# Patient Record
Sex: Female | Born: 1950 | Race: White | Hispanic: No | Marital: Married | Smoking: Never smoker
Health system: Southern US, Community
[De-identification: ages and names within clinical notes are randomized; demographics above are authoritative.]

## PROBLEM LIST (undated history)

## (undated) DIAGNOSIS — E7800 Pure hypercholesterolemia, unspecified: Secondary | ICD-10-CM

## (undated) DIAGNOSIS — Z86718 Personal history of other venous thrombosis and embolism: Secondary | ICD-10-CM

## (undated) DIAGNOSIS — I447 Left bundle-branch block, unspecified: Secondary | ICD-10-CM

## (undated) DIAGNOSIS — Z8614 Personal history of Methicillin resistant Staphylococcus aureus infection: Secondary | ICD-10-CM

## (undated) DIAGNOSIS — I10 Essential (primary) hypertension: Secondary | ICD-10-CM

## (undated) DIAGNOSIS — I482 Chronic atrial fibrillation, unspecified: Secondary | ICD-10-CM

## (undated) DIAGNOSIS — F329 Major depressive disorder, single episode, unspecified: Secondary | ICD-10-CM

## (undated) DIAGNOSIS — F419 Anxiety disorder, unspecified: Secondary | ICD-10-CM

## (undated) DIAGNOSIS — G600 Hereditary motor and sensory neuropathy: Secondary | ICD-10-CM

## (undated) DIAGNOSIS — F40240 Claustrophobia: Secondary | ICD-10-CM

## (undated) DIAGNOSIS — N189 Chronic kidney disease, unspecified: Secondary | ICD-10-CM

## (undated) DIAGNOSIS — I5032 Chronic diastolic (congestive) heart failure: Secondary | ICD-10-CM

## (undated) DIAGNOSIS — K219 Gastro-esophageal reflux disease without esophagitis: Secondary | ICD-10-CM

## (undated) DIAGNOSIS — R569 Unspecified convulsions: Secondary | ICD-10-CM

## (undated) DIAGNOSIS — F32A Depression, unspecified: Secondary | ICD-10-CM

## (undated) DIAGNOSIS — E039 Hypothyroidism, unspecified: Secondary | ICD-10-CM

## (undated) DIAGNOSIS — E119 Type 2 diabetes mellitus without complications: Secondary | ICD-10-CM

## (undated) DIAGNOSIS — G4730 Sleep apnea, unspecified: Secondary | ICD-10-CM

## (undated) DIAGNOSIS — I2510 Atherosclerotic heart disease of native coronary artery without angina pectoris: Secondary | ICD-10-CM

## (undated) DIAGNOSIS — M7060 Trochanteric bursitis, unspecified hip: Secondary | ICD-10-CM

## (undated) DIAGNOSIS — G43909 Migraine, unspecified, not intractable, without status migrainosus: Secondary | ICD-10-CM

## (undated) DIAGNOSIS — R55 Syncope and collapse: Secondary | ICD-10-CM

## (undated) HISTORY — DX: Major depressive disorder, single episode, unspecified: F32.9

## (undated) HISTORY — DX: Trochanteric bursitis, unspecified hip: M70.60

## (undated) HISTORY — DX: Essential (primary) hypertension: I10

## (undated) HISTORY — DX: Hereditary motor and sensory neuropathy: G60.0

## (undated) HISTORY — DX: Hypothyroidism, unspecified: E03.9

## (undated) HISTORY — DX: Unspecified convulsions: R56.9

## (undated) HISTORY — DX: Chronic kidney disease, unspecified: N18.9

## (undated) HISTORY — DX: Syncope and collapse: R55

## (undated) HISTORY — DX: Anxiety disorder, unspecified: F41.9

## (undated) HISTORY — DX: Gastro-esophageal reflux disease without esophagitis: K21.9

## (undated) HISTORY — PX: TOE AMPUTATION: SHX809

## (undated) HISTORY — PX: FEMUR SURGERY: SHX943

## (undated) HISTORY — DX: Depression, unspecified: F32.A

## (undated) HISTORY — DX: Type 2 diabetes mellitus without complications: E11.9

## (undated) HISTORY — PX: TONSILLECTOMY AND ADENOIDECTOMY: SUR1326

## (undated) HISTORY — DX: Pure hypercholesterolemia, unspecified: E78.00

## (undated) HISTORY — DX: Chronic diastolic (congestive) heart failure: I50.32

## (undated) HISTORY — DX: Personal history of Methicillin resistant Staphylococcus aureus infection: Z86.14

## (undated) HISTORY — DX: Claustrophobia: F40.240

## (undated) HISTORY — DX: Personal history of other venous thrombosis and embolism: Z86.718

## (undated) HISTORY — DX: Sleep apnea, unspecified: G47.30

## (undated) HISTORY — DX: Chronic atrial fibrillation, unspecified: I48.20

## (undated) HISTORY — DX: Migraine, unspecified, not intractable, without status migrainosus: G43.909

## (undated) HISTORY — DX: Morbid (severe) obesity due to excess calories: E66.01

## (undated) HISTORY — DX: Left bundle-branch block, unspecified: I44.7

## (undated) HISTORY — PX: TOTAL ABDOMINAL HYSTERECTOMY: SHX209

## (undated) HISTORY — DX: Atherosclerotic heart disease of native coronary artery without angina pectoris: I25.10

---

## 2008-11-13 ENCOUNTER — Inpatient Hospital Stay: Payer: Self-pay | Admitting: *Deleted

## 2009-01-06 ENCOUNTER — Emergency Department: Payer: Self-pay | Admitting: Emergency Medicine

## 2009-03-07 ENCOUNTER — Inpatient Hospital Stay: Payer: Self-pay | Admitting: Internal Medicine

## 2009-04-16 ENCOUNTER — Ambulatory Visit: Payer: Self-pay | Admitting: Cardiology

## 2009-04-16 ENCOUNTER — Ambulatory Visit: Payer: Self-pay | Admitting: Ophthalmology

## 2009-04-29 ENCOUNTER — Ambulatory Visit: Payer: Self-pay | Admitting: Ophthalmology

## 2009-05-24 ENCOUNTER — Ambulatory Visit: Payer: Self-pay | Admitting: Ophthalmology

## 2009-06-03 ENCOUNTER — Ambulatory Visit: Payer: Self-pay | Admitting: Ophthalmology

## 2010-06-16 ENCOUNTER — Inpatient Hospital Stay: Payer: Self-pay | Admitting: Internal Medicine

## 2010-07-09 ENCOUNTER — Ambulatory Visit (HOSPITAL_COMMUNITY)
Admission: RE | Admit: 2010-07-09 | Discharge: 2010-07-09 | Disposition: A | Discharge status: Home / Self Care | Payer: Medicare Other | Source: Other Acute Inpatient Hospital | Attending: Internal Medicine | Admitting: Internal Medicine

## 2010-07-09 ENCOUNTER — Other Ambulatory Visit (HOSPITAL_COMMUNITY): Payer: Medicare Other | Attending: Internal Medicine

## 2010-07-09 ENCOUNTER — Inpatient Hospital Stay
Admission: AD | Admit: 2010-07-09 | Discharge: 2010-07-20 | Disposition: A | Discharge status: Other Acute Inpatient Hospital | Payer: Medicare Other | Source: Ambulatory Visit | Attending: Internal Medicine | Admitting: Internal Medicine

## 2010-07-09 DIAGNOSIS — J9600 Acute respiratory failure, unspecified whether with hypoxia or hypercapnia: Secondary | ICD-10-CM | POA: Insufficient documentation

## 2010-07-09 LAB — BLOOD GAS, ARTERIAL
Acid-Base Excess: 1.1 mmol/L (ref 0.0–2.0)
Bicarbonate: 25.1 mEq/L — ABNORMAL HIGH (ref 20.0–24.0)
FIO2: 0.3 %
Patient temperature: 98.6
TCO2: 26.4 mmol/L (ref 0–100)
pH, Arterial: 7.418 — ABNORMAL HIGH (ref 7.350–7.400)

## 2010-07-10 ENCOUNTER — Other Ambulatory Visit (HOSPITAL_COMMUNITY): Payer: Medicare Other | Attending: Internal Medicine

## 2010-07-10 DIAGNOSIS — I4891 Unspecified atrial fibrillation: Secondary | ICD-10-CM

## 2010-07-10 LAB — DIFFERENTIAL
Basophils Absolute: 0 10*3/uL (ref 0.0–0.1)
Basophils Relative: 0 % (ref 0–1)
Monocytes Absolute: 2 10*3/uL — ABNORMAL HIGH (ref 0.1–1.0)
Neutro Abs: 14.5 10*3/uL — ABNORMAL HIGH (ref 1.7–7.7)
Neutrophils Relative %: 72 % (ref 43–77)

## 2010-07-10 LAB — COMPREHENSIVE METABOLIC PANEL
ALT: <8 U/L (ref 0–35)
AST: 17 U/L (ref 0–37)
Albumin: 2.3 g/dL — ABNORMAL LOW (ref 3.5–5.2)
Alkaline Phosphatase: 202 U/L — ABNORMAL HIGH (ref 39–117)
Chloride: 106 mEq/L (ref 96–112)
GFR calc Af Amer: >60 mL/min (ref >60)
Potassium: 4.2 mEq/L (ref 3.5–5.1)
Total Bilirubin: 2.6 mg/dL — ABNORMAL HIGH (ref 0.3–1.2)

## 2010-07-10 LAB — URINE MICROSCOPIC-ADD ON

## 2010-07-10 LAB — PROTIME-INR: INR: 1.28 (ref 0.00–1.49)

## 2010-07-10 LAB — URINALYSIS, ROUTINE W REFLEX MICROSCOPIC
Glucose, UA: NEGATIVE mg/dL
Ketones, ur: 15 mg/dL — AB
Nitrite: NEGATIVE
pH: 6 (ref 5.0–8.0)

## 2010-07-10 LAB — CBC
HCT: 35.5 % — ABNORMAL LOW (ref 36.0–46.0)
Hemoglobin: 11.1 g/dL — ABNORMAL LOW (ref 12.0–15.0)
Hemoglobin: 10.3 g/dL — ABNORMAL LOW (ref 12.0–15.0)
MCHC: 31.5 g/dL (ref 30.0–36.0)
RBC: 4.48 MIL/uL (ref 3.87–5.11)
WBC: 27.3 10*3/uL — ABNORMAL HIGH (ref 4.0–10.5)

## 2010-07-10 LAB — TSH: TSH: 9.162 u[IU]/mL — ABNORMAL HIGH (ref 0.350–4.500)

## 2010-07-10 LAB — PREALBUMIN: Prealbumin: 10.6 mg/dL — ABNORMAL LOW (ref 17.0–34.0)

## 2010-07-10 LAB — ABO/RH: ABO/RH(D): A POS

## 2010-07-11 DIAGNOSIS — I517 Cardiomegaly: Secondary | ICD-10-CM

## 2010-07-11 DIAGNOSIS — J9620 Acute and chronic respiratory failure, unspecified whether with hypoxia or hypercapnia: Secondary | ICD-10-CM

## 2010-07-11 DIAGNOSIS — Z930 Tracheostomy status: Secondary | ICD-10-CM

## 2010-07-11 DIAGNOSIS — G0481 Other encephalitis and encephalomyelitis: Secondary | ICD-10-CM

## 2010-07-11 LAB — BASIC METABOLIC PANEL
Calcium: 8.8 mg/dL (ref 8.4–10.5)
GFR calc non Af Amer: >60 mL/min (ref >60)
Potassium: 4.1 mEq/L (ref 3.5–5.1)
Sodium: 138 mEq/L (ref 135–145)

## 2010-07-11 LAB — GLUCOSE, CAPILLARY: Glucose-Capillary: 295 mg/dL — ABNORMAL HIGH (ref 70–99)

## 2010-07-11 LAB — CBC
Platelets: 303 10*3/uL (ref 150–400)
RDW: 19.3 % — ABNORMAL HIGH (ref 11.5–15.5)
WBC: 18.7 10*3/uL — ABNORMAL HIGH (ref 4.0–10.5)

## 2010-07-11 LAB — VANCOMYCIN, TROUGH: Vancomycin Tr: 25.6 ug/mL (ref 10.0–20.0)

## 2010-07-12 LAB — CBC
HCT: 30.3 % — ABNORMAL LOW (ref 36.0–46.0)
Hemoglobin: 9.6 g/dL — ABNORMAL LOW (ref 12.0–15.0)
MCH: 23.1 pg — ABNORMAL LOW (ref 26.0–34.0)
MCHC: 31.7 g/dL (ref 30.0–36.0)
MCV: 72.8 fL — ABNORMAL LOW (ref 78.0–100.0)
RBC: 4.16 MIL/uL (ref 3.87–5.11)

## 2010-07-12 LAB — BASIC METABOLIC PANEL
BUN: 32 mg/dL — ABNORMAL HIGH (ref 6–23)
CO2: 30 mEq/L (ref 19–32)
Chloride: 102 mEq/L (ref 96–112)
GFR calc non Af Amer: >60 mL/min (ref >60)
Glucose, Bld: 336 mg/dL — ABNORMAL HIGH (ref 70–99)
Potassium: 4.3 mEq/L (ref 3.5–5.1)
Sodium: 139 mEq/L (ref 135–145)

## 2010-07-12 LAB — URINE CULTURE
Colony Count: >=100000
Culture  Setup Time: 201204200132
Special Requests: NEGATIVE

## 2010-07-12 LAB — PROTIME-INR: Prothrombin Time: 16.5 seconds — ABNORMAL HIGH (ref 11.6–15.2)

## 2010-07-13 LAB — CBC
Hemoglobin: 10.5 g/dL — ABNORMAL LOW (ref 12.0–15.0)
MCH: 23 pg — ABNORMAL LOW (ref 26.0–34.0)
MCHC: 31.3 g/dL (ref 30.0–36.0)
RDW: 19.7 % — ABNORMAL HIGH (ref 11.5–15.5)

## 2010-07-13 LAB — PROTIME-INR
INR: 1.27 (ref 0.00–1.49)
Prothrombin Time: 16.1 seconds — ABNORMAL HIGH (ref 11.6–15.2)

## 2010-07-13 LAB — BASIC METABOLIC PANEL
BUN: 31 mg/dL — ABNORMAL HIGH (ref 6–23)
Creatinine, Ser: 0.83 mg/dL (ref 0.4–1.2)
GFR calc non Af Amer: >60 mL/min (ref >60)
Glucose, Bld: 195 mg/dL — ABNORMAL HIGH (ref 70–99)
Potassium: 3.7 mEq/L (ref 3.5–5.1)

## 2010-07-13 LAB — CULTURE, RESPIRATORY W GRAM STAIN

## 2010-07-14 DIAGNOSIS — Z930 Tracheostomy status: Secondary | ICD-10-CM

## 2010-07-14 DIAGNOSIS — G0481 Other encephalitis and encephalomyelitis: Secondary | ICD-10-CM

## 2010-07-14 DIAGNOSIS — J9620 Acute and chronic respiratory failure, unspecified whether with hypoxia or hypercapnia: Secondary | ICD-10-CM

## 2010-07-14 LAB — CBC
HCT: 33 % — ABNORMAL LOW (ref 36.0–46.0)
MCH: 22.8 pg — ABNORMAL LOW (ref 26.0–34.0)
MCV: 72.4 fL — ABNORMAL LOW (ref 78.0–100.0)
RBC: 4.56 MIL/uL (ref 3.87–5.11)
WBC: 14.2 10*3/uL — ABNORMAL HIGH (ref 4.0–10.5)

## 2010-07-14 LAB — BASIC METABOLIC PANEL
CO2: 36 mEq/L — ABNORMAL HIGH (ref 19–32)
Chloride: 90 mEq/L — ABNORMAL LOW (ref 96–112)
GFR calc Af Amer: >60 mL/min (ref >60)
Glucose, Bld: 278 mg/dL — ABNORMAL HIGH (ref 70–99)
Sodium: 141 mEq/L (ref 135–145)

## 2010-07-14 LAB — CROSSMATCH
ABO/RH(D): A POS
Antibody Screen: NEGATIVE
Unit division: 0

## 2010-07-15 LAB — PROTIME-INR
INR: 1.24 (ref 0.00–1.49)
Prothrombin Time: 15.8 seconds — ABNORMAL HIGH (ref 11.6–15.2)

## 2010-07-16 LAB — URINALYSIS, ROUTINE W REFLEX MICROSCOPIC
Ketones, ur: NEGATIVE mg/dL
Leukocytes, UA: NEGATIVE
Nitrite: NEGATIVE
Specific Gravity, Urine: 1.023 (ref 1.005–1.030)
pH: 8 (ref 5.0–8.0)

## 2010-07-16 LAB — BASIC METABOLIC PANEL
CO2: 37 mEq/L — ABNORMAL HIGH (ref 19–32)
Calcium: 9.2 mg/dL (ref 8.4–10.5)
Chloride: 93 mEq/L — ABNORMAL LOW (ref 96–112)
GFR calc Af Amer: >60 mL/min (ref >60)
Potassium: 4 mEq/L (ref 3.5–5.1)
Sodium: 137 mEq/L (ref 135–145)

## 2010-07-16 LAB — URINE MICROSCOPIC-ADD ON

## 2010-07-16 LAB — CBC
Hemoglobin: 11.8 g/dL — ABNORMAL LOW (ref 12.0–15.0)
RBC: 5.2 MIL/uL — ABNORMAL HIGH (ref 3.87–5.11)
WBC: 18.3 10*3/uL — ABNORMAL HIGH (ref 4.0–10.5)

## 2010-07-16 LAB — VANCOMYCIN, TROUGH: Vancomycin Tr: 13.9 ug/mL (ref 10.0–20.0)

## 2010-07-17 ENCOUNTER — Other Ambulatory Visit (HOSPITAL_COMMUNITY): Payer: Medicare Other | Attending: Internal Medicine

## 2010-07-17 LAB — URINE CULTURE: Colony Count: NO GROWTH

## 2010-07-17 LAB — PROTIME-INR
INR: 1.32 (ref 0.00–1.49)
Prothrombin Time: 16.6 seconds — ABNORMAL HIGH (ref 11.6–15.2)

## 2010-07-18 LAB — CBC
HCT: 34.5 % — ABNORMAL LOW (ref 36.0–46.0)
Hemoglobin: 10.8 g/dL — ABNORMAL LOW (ref 12.0–15.0)
MCH: 23.1 pg — ABNORMAL LOW (ref 26.0–34.0)
MCV: 73.9 fL — ABNORMAL LOW (ref 78.0–100.0)
Platelets: 261 10*3/uL (ref 150–400)
RBC: 4.67 MIL/uL (ref 3.87–5.11)
WBC: 18.3 10*3/uL — ABNORMAL HIGH (ref 4.0–10.5)

## 2010-07-18 LAB — BASIC METABOLIC PANEL
BUN: 28 mg/dL — ABNORMAL HIGH (ref 6–23)
CO2: 34 mEq/L — ABNORMAL HIGH (ref 19–32)
Chloride: 96 mEq/L (ref 96–112)
Creatinine, Ser: 0.75 mg/dL (ref 0.4–1.2)
Glucose, Bld: 218 mg/dL — ABNORMAL HIGH (ref 70–99)
Potassium: 3.9 mEq/L (ref 3.5–5.1)

## 2010-07-18 LAB — PROTIME-INR
INR: 1.36 (ref 0.00–1.49)
Prothrombin Time: 17 seconds — ABNORMAL HIGH (ref 11.6–15.2)

## 2010-07-18 LAB — MAGNESIUM: Magnesium: 2.4 mg/dL (ref 1.5–2.5)

## 2010-07-18 NOTE — Consult Note (Signed)
[name] Kent, [place] NO.:  [id number]  MEDICAL RECORD NO.:  [id number]           PATIENT TYPE:  LOCATION:                                 FACILITY:  PHYSICIAN:  Rollene Rotunda, MD, [facility]    DATE OF BIRTH:  DATE OF CONSULTATION: DATE OF DISCHARGE:                                CONSULTATION   PRIMARY CARE PHYSICIAN:  Dr. Mliss Fritz.  REASON FOR CONSULTATION:  Evaluate the patient with atrial fibrillation.  HISTORY OF PRESENT ILLNESS:  The patient is a 60 year old.  She has a history of atrial fibrillation and apparent cardiomyopathy.  She was hospitalized recently at [facility] with altered mental status. She had respiratory failure.  She has been treated for encephalopathy of unclear etiology.  She initially had improvement in her mental status and was extubated, but had to be reintubated with respiratory distress. She developed parotitis.  She has had persistent atrial fibrillation throughout this.  There were brady arrhythmias and pauses noted.  In fact, she has had to have beta-blockers held and discontinued during her hospitalization at [facility].  She is now transferred for chronic vent management.  However, she has had some tachybrady rates.  In fact, we were called for wide complex arrhythmia.  This is atrial fibrillation with her baseline interventricular conduction delay.  There was some very wide aberrant arrhythmia which is artifact.  However, she has had rapid rates punctuated by some brady arrhythmias, but no sustained pauses.  She is currently on IV amiodarone and q.6 h. IV metoprolol. She is tracked and minimally responsive.  She reports no pain.  She does not appear to be in any distress at this point.  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  ALLERGIES:  DILAUDID and TETRACYCLINE.  SOCIAL HISTORY:  The patient is married.  She apparently rarely drink alcohol before this.  Her tox screen was negative on admission.  She does not smoke cigarettes.  FAMILY HISTORY:  Contributory for apparent coronary artery disease, but she is unable to give details.  REVIEW OF SYSTEMS:  Unable to obtain from the patient who is minimally responsive.  PHYSICAL EXAMINATION:  GENERAL:  The patient is in no distress. However, she is chronically ill. VITAL SIGNS:  Blood pressure 118/71, heart rate 80s to 120s, irregular and respiratory rate 22. HEENT:  Eyes unremarkable, pupils equal, round, and reactive to light, unable to appreciate oral mucosa, she does have tender right carotid which is swollen. LYMPHATICS:  Unable to appreciate adenopathy. LUNGS:  Diminished breath sounds anteriorly. CHEST:  Unremarkable. HEART:  Distant heart sounds, S1 and S2 within normal limits, no S3, irregular, no obvious murmurs. ABDOMEN:  Morbidly obese, positive bowel sounds, no rebound, no guarding, no organomegaly. SKIN:  No rashes, no nodules. EXTREMITIES:  Pulses 2+, no edema. NEURO:  She opens her eyes and seems to respond no by shaking her head to questions, but is minimally responsive, she seems to move all extremities.  EKG:  Atrial fibrillation, interventricular conduction delay, tachybrady rates.  ASSESSMENT AND PLAN: 1. Atrial fibrillation.  The patient's atrial fibrillation will be     difficult to control with her acute illness.  She still felt to     have ongoing infection.  She is vent dependent.  She does have     apparent discomfort in her neck related to her parotitis.  She is     on IV amiodarone right now.   I think we should continue this for 24     hours.  I would increase the metoprolol to q.4 h. IV for now.  If     she has no brady rates, we could reinitiate the digoxin.  We can     titrate to p.o. beta blockers and use Cardizem based on her     response to the above med suggestions if there is no further     bradyarrhythmias.  At this point any thoughts of intervention for     tachybrady syndrome such as ablation or pacemaker would be     contraindicated given her comorbidities. 2. Cardiomyopathy.  She said this mildly reduced ejection fraction     reported.  She is not in overt volume overload now.  She can be     managed with current medications.     Rollene Rotunda, MD, [facility]     JH/MEDQ  D:  07/10/2010  T:  07/11/2010  Job:  [id number]  Electronically Signed by Rollene Rotunda MD [facility] on 07/18/2010 11:50:18 AM

## 2010-07-19 LAB — PROTIME-INR
INR: 1.54 — ABNORMAL HIGH (ref 0.00–1.49)
Prothrombin Time: 18.7 seconds — ABNORMAL HIGH (ref 11.6–15.2)

## 2010-07-20 ENCOUNTER — Other Ambulatory Visit (HOSPITAL_COMMUNITY): Payer: Medicare Other | Attending: Internal Medicine

## 2010-07-20 ENCOUNTER — Inpatient Hospital Stay (HOSPITAL_COMMUNITY): Payer: Medicare Other

## 2010-07-20 ENCOUNTER — Inpatient Hospital Stay (HOSPITAL_COMMUNITY)
Admission: EM | Admit: 2010-07-20 | Discharge: 2010-07-25 | DRG: 314 | Disposition: A | Discharge status: Other Acute Inpatient Hospital | Payer: Medicare Other | Source: Other Acute Inpatient Hospital | Attending: Internal Medicine | Admitting: Internal Medicine

## 2010-07-20 DIAGNOSIS — Z794 Long term (current) use of insulin: Secondary | ICD-10-CM

## 2010-07-20 DIAGNOSIS — D62 Acute posthemorrhagic anemia: Secondary | ICD-10-CM | POA: Diagnosis present

## 2010-07-20 DIAGNOSIS — J9610 Chronic respiratory failure, unspecified whether with hypoxia or hypercapnia: Secondary | ICD-10-CM

## 2010-07-20 DIAGNOSIS — R578 Other shock: Secondary | ICD-10-CM | POA: Diagnosis present

## 2010-07-20 DIAGNOSIS — Z430 Encounter for attention to tracheostomy: Secondary | ICD-10-CM

## 2010-07-20 DIAGNOSIS — K661 Hemoperitoneum: Secondary | ICD-10-CM

## 2010-07-20 DIAGNOSIS — J189 Pneumonia, unspecified organism: Secondary | ICD-10-CM | POA: Diagnosis not present

## 2010-07-20 DIAGNOSIS — N179 Acute kidney failure, unspecified: Secondary | ICD-10-CM

## 2010-07-20 DIAGNOSIS — I739 Peripheral vascular disease, unspecified: Secondary | ICD-10-CM | POA: Diagnosis present

## 2010-07-20 DIAGNOSIS — I4891 Unspecified atrial fibrillation: Secondary | ICD-10-CM

## 2010-07-20 DIAGNOSIS — E039 Hypothyroidism, unspecified: Secondary | ICD-10-CM | POA: Diagnosis present

## 2010-07-20 DIAGNOSIS — G9341 Metabolic encephalopathy: Secondary | ICD-10-CM

## 2010-07-20 DIAGNOSIS — E119 Type 2 diabetes mellitus without complications: Secondary | ICD-10-CM | POA: Diagnosis present

## 2010-07-20 DIAGNOSIS — IMO0002 Reserved for concepts with insufficient information to code with codable children: Secondary | ICD-10-CM

## 2010-07-20 DIAGNOSIS — Z931 Gastrostomy status: Secondary | ICD-10-CM

## 2010-07-20 DIAGNOSIS — R58 Hemorrhage, not elsewhere classified: Principal | ICD-10-CM | POA: Diagnosis present

## 2010-07-20 DIAGNOSIS — T45515A Adverse effect of anticoagulants, initial encounter: Secondary | ICD-10-CM | POA: Diagnosis present

## 2010-07-20 LAB — DIGOXIN LEVEL: Digoxin Level: 1.1 ng/mL (ref 0.8–2.0)

## 2010-07-20 LAB — CBC
HCT: 21.8 % — ABNORMAL LOW (ref 36.0–46.0)
MCH: 23 pg — ABNORMAL LOW (ref 26.0–34.0)
MCV: 72.4 fL — ABNORMAL LOW (ref 78.0–100.0)
Platelets: 309 10*3/uL (ref 150–400)
Platelets: 209 10*3/uL (ref 150–400)
RDW: 20.6 % — ABNORMAL HIGH (ref 11.5–15.5)
RDW: 19.7 % — ABNORMAL HIGH (ref 11.5–15.5)
WBC: 30.5 10*3/uL — ABNORMAL HIGH (ref 4.0–10.5)

## 2010-07-20 LAB — BASIC METABOLIC PANEL
BUN: 48 mg/dL — ABNORMAL HIGH (ref 6–23)
CO2: 23 mEq/L (ref 19–32)
Calcium: 8.6 mg/dL (ref 8.4–10.5)
Calcium: 8.1 mg/dL — ABNORMAL LOW (ref 8.4–10.5)
Creatinine, Ser: 1.62 mg/dL — ABNORMAL HIGH (ref 0.4–1.2)
Creatinine, Ser: 1.68 mg/dL — ABNORMAL HIGH (ref 0.4–1.2)
GFR calc Af Amer: 39 mL/min — ABNORMAL LOW (ref >60)
GFR calc non Af Amer: 33 mL/min — ABNORMAL LOW (ref >60)
GFR calc non Af Amer: 31 mL/min — ABNORMAL LOW (ref >60)
Glucose, Bld: 366 mg/dL — ABNORMAL HIGH (ref 70–99)

## 2010-07-20 LAB — PROTIME-INR
INR: 2.03 — ABNORMAL HIGH (ref 0.00–1.49)
Prothrombin Time: 25.6 seconds — ABNORMAL HIGH (ref 11.6–15.2)
Prothrombin Time: 22.6 seconds — ABNORMAL HIGH (ref 11.6–15.2)
Prothrombin Time: 23.1 seconds — ABNORMAL HIGH (ref 11.6–15.2)

## 2010-07-20 LAB — LACTIC ACID, PLASMA: Lactic Acid, Venous: 2.3 mmol/L — ABNORMAL HIGH (ref 0.5–2.2)

## 2010-07-20 LAB — POCT I-STAT 3, ART BLOOD GAS (G3+)
O2 Saturation: 100 %
pCO2 arterial: 24.5 mmHg — ABNORMAL LOW (ref 35.0–45.0)
pO2, Arterial: 182 mmHg — ABNORMAL HIGH (ref 80.0–100.0)

## 2010-07-20 LAB — PREPARE RBC (CROSSMATCH)

## 2010-07-20 LAB — APTT: aPTT: 36 seconds (ref 24–37)

## 2010-07-20 LAB — HEMOGLOBIN AND HEMATOCRIT, BLOOD
HCT: 19.3 % — ABNORMAL LOW (ref 36.0–46.0)
HCT: 23.4 % — ABNORMAL LOW (ref 36.0–46.0)
Hemoglobin: 6.2 g/dL — CL (ref 12.0–15.0)

## 2010-07-20 LAB — HEMOCCULT GUIAC POC 1CARD (OFFICE): Fecal Occult Bld: NEGATIVE

## 2010-07-21 ENCOUNTER — Inpatient Hospital Stay (HOSPITAL_COMMUNITY): Payer: Medicare Other

## 2010-07-21 LAB — CBC
HCT: 17.5 % — ABNORMAL LOW (ref 36.0–46.0)
HCT: 20.6 % — ABNORMAL LOW (ref 36.0–46.0)
MCH: 26.6 pg (ref 26.0–34.0)
MCHC: 34.5 g/dL (ref 30.0–36.0)
MCHC: 34.1 g/dL (ref 30.0–36.0)
MCV: 75.8 fL — ABNORMAL LOW (ref 78.0–100.0)
Platelets: 169 10*3/uL (ref 150–400)
Platelets: 228 10*3/uL (ref 150–400)
RBC: 2.31 MIL/uL — ABNORMAL LOW (ref 3.87–5.11)
RBC: 2.14 MIL/uL — ABNORMAL LOW (ref 3.87–5.11)
RDW: 19 % — ABNORMAL HIGH (ref 11.5–15.5)
RDW: 19.7 % — ABNORMAL HIGH (ref 11.5–15.5)
WBC: 25.9 10*3/uL — ABNORMAL HIGH (ref 4.0–10.5)
WBC: 26.9 10*3/uL — ABNORMAL HIGH (ref 4.0–10.5)

## 2010-07-21 LAB — DIFFERENTIAL
Basophils Absolute: 0 10*3/uL (ref 0.0–0.1)
Eosinophils Absolute: 0 10*3/uL (ref 0.0–0.7)
Lymphocytes Relative: 9 % — ABNORMAL LOW (ref 12–46)
Lymphs Abs: 2.4 10*3/uL (ref 0.7–4.0)
Monocytes Relative: 6 % (ref 3–12)

## 2010-07-21 LAB — VANCOMYCIN, TROUGH: Vancomycin Tr: 21.8 ug/mL — ABNORMAL HIGH (ref 10.0–20.0)

## 2010-07-21 LAB — GLUCOSE, CAPILLARY
Glucose-Capillary: 243 mg/dL — ABNORMAL HIGH (ref 70–99)
Glucose-Capillary: 267 mg/dL — ABNORMAL HIGH (ref 70–99)
Glucose-Capillary: 322 mg/dL — ABNORMAL HIGH (ref 70–99)

## 2010-07-21 LAB — CARDIAC PANEL(CRET KIN+CKTOT+MB+TROPI): Troponin I: 0.27 ng/mL — ABNORMAL HIGH (ref 0.00–0.06)

## 2010-07-21 LAB — POCT I-STAT 3, ART BLOOD GAS (G3+)
TCO2: 29 mmol/L (ref 0–100)
pCO2 arterial: 40.5 mmHg (ref 35.0–45.0)
pH, Arterial: 7.451 — ABNORMAL HIGH (ref 7.350–7.400)

## 2010-07-21 LAB — PROTIME-INR
INR: 1.79 — ABNORMAL HIGH (ref 0.00–1.49)
INR: 1.68 — ABNORMAL HIGH (ref 0.00–1.49)
Prothrombin Time: 20 seconds — ABNORMAL HIGH (ref 11.6–15.2)

## 2010-07-21 LAB — APTT: aPTT: 35 seconds (ref 24–37)

## 2010-07-22 ENCOUNTER — Inpatient Hospital Stay (HOSPITAL_COMMUNITY): Payer: Medicare Other

## 2010-07-22 DIAGNOSIS — R069 Unspecified abnormalities of breathing: Secondary | ICD-10-CM

## 2010-07-22 LAB — CBC
HCT: 22 % — ABNORMAL LOW (ref 36.0–46.0)
HCT: 20.2 % — ABNORMAL LOW (ref 36.0–46.0)
Hemoglobin: 7.9 g/dL — ABNORMAL LOW (ref 12.0–15.0)
MCH: 29 pg (ref 26.0–34.0)
MCH: 29 pg (ref 26.0–34.0)
MCV: 80.9 fL (ref 78.0–100.0)
MCV: 82.4 fL (ref 78.0–100.0)
Platelets: 219 10*3/uL (ref 150–400)
RBC: 2.72 MIL/uL — ABNORMAL LOW (ref 3.87–5.11)
RBC: 2.45 MIL/uL — ABNORMAL LOW (ref 3.87–5.11)
RDW: 18.6 % — ABNORMAL HIGH (ref 11.5–15.5)

## 2010-07-22 LAB — PREPARE FRESH FROZEN PLASMA
Unit division: 0
Unit division: 0
Unit division: 0

## 2010-07-22 LAB — GLUCOSE, CAPILLARY
Glucose-Capillary: 304 mg/dL — ABNORMAL HIGH (ref 70–99)
Glucose-Capillary: 261 mg/dL — ABNORMAL HIGH (ref 70–99)
Glucose-Capillary: 258 mg/dL — ABNORMAL HIGH (ref 70–99)

## 2010-07-22 LAB — MAGNESIUM: Magnesium: 3.4 mg/dL — ABNORMAL HIGH (ref 1.5–2.5)

## 2010-07-22 LAB — BASIC METABOLIC PANEL
CO2: 25 mEq/L (ref 19–32)
Chloride: 98 mEq/L (ref 96–112)
GFR calc Af Amer: 38 mL/min — ABNORMAL LOW (ref >60)
Sodium: 129 mEq/L — ABNORMAL LOW (ref 135–145)

## 2010-07-22 LAB — PHOSPHORUS: Phosphorus: 4 mg/dL (ref 2.3–4.6)

## 2010-07-23 ENCOUNTER — Inpatient Hospital Stay (HOSPITAL_COMMUNITY): Payer: Medicare Other

## 2010-07-23 LAB — GLUCOSE, CAPILLARY
Glucose-Capillary: 180 mg/dL — ABNORMAL HIGH (ref 70–99)
Glucose-Capillary: 204 mg/dL — ABNORMAL HIGH (ref 70–99)

## 2010-07-23 LAB — CBC
HCT: 19.4 % — ABNORMAL LOW (ref 36.0–46.0)
HCT: 20.3 % — ABNORMAL LOW (ref 36.0–46.0)
Hemoglobin: 6.7 g/dL — CL (ref 12.0–15.0)
Hemoglobin: 6.9 g/dL — CL (ref 12.0–15.0)
MCHC: 34.5 g/dL (ref 30.0–36.0)
MCHC: 34.5 g/dL (ref 30.0–36.0)
MCV: 84.2 fL (ref 78.0–100.0)
Platelets: 135 10*3/uL — ABNORMAL LOW (ref 150–400)
RBC: 2.36 MIL/uL — ABNORMAL LOW (ref 3.87–5.11)
RBC: 2.4 MIL/uL — ABNORMAL LOW (ref 3.87–5.11)
RDW: 18.1 % — ABNORMAL HIGH (ref 11.5–15.5)
WBC: 14.3 10*3/uL — ABNORMAL HIGH (ref 4.0–10.5)

## 2010-07-23 LAB — COMPREHENSIVE METABOLIC PANEL
ALT: 148 U/L — ABNORMAL HIGH (ref 0–35)
Albumin: 2.5 g/dL — ABNORMAL LOW (ref 3.5–5.2)
Alkaline Phosphatase: 148 U/L — ABNORMAL HIGH (ref 39–117)
Chloride: 100 mEq/L (ref 96–112)
Glucose, Bld: 184 mg/dL — ABNORMAL HIGH (ref 70–99)
Potassium: 4.3 mEq/L (ref 3.5–5.1)
Sodium: 136 mEq/L (ref 135–145)
Total Bilirubin: 3.2 mg/dL — ABNORMAL HIGH (ref 0.3–1.2)
Total Protein: 5.7 g/dL — ABNORMAL LOW (ref 6.0–8.3)

## 2010-07-23 LAB — CULTURE, BLOOD (ROUTINE X 2)
Culture  Setup Time: 201204260021
Culture  Setup Time: 201204260216
Culture: NO GROWTH

## 2010-07-23 LAB — PROTIME-INR
INR: 1.19 (ref 0.00–1.49)
Prothrombin Time: 15.3 seconds — ABNORMAL HIGH (ref 11.6–15.2)

## 2010-07-23 LAB — VANCOMYCIN, RANDOM: Vancomycin Rm: 17.5 ug/mL

## 2010-07-24 ENCOUNTER — Inpatient Hospital Stay (HOSPITAL_COMMUNITY): Payer: Medicare Other

## 2010-07-24 LAB — COMPREHENSIVE METABOLIC PANEL
AST: 197 U/L — ABNORMAL HIGH (ref 0–37)
Albumin: 2.5 g/dL — ABNORMAL LOW (ref 3.5–5.2)
Chloride: 102 mEq/L (ref 96–112)
Creatinine, Ser: 0.75 mg/dL (ref 0.4–1.2)
GFR calc Af Amer: >60 mL/min (ref >60)
Sodium: 138 mEq/L (ref 135–145)
Total Bilirubin: 3.3 mg/dL — ABNORMAL HIGH (ref 0.3–1.2)

## 2010-07-24 LAB — CROSSMATCH
Unit division: 0
Unit division: 0

## 2010-07-24 LAB — CBC
MCH: 28.7 pg (ref 26.0–34.0)
Platelets: 122 10*3/uL — ABNORMAL LOW (ref 150–400)
RBC: 2.44 MIL/uL — ABNORMAL LOW (ref 3.87–5.11)
WBC: 11.2 10*3/uL — ABNORMAL HIGH (ref 4.0–10.5)

## 2010-07-24 LAB — GLUCOSE, CAPILLARY
Glucose-Capillary: 170 mg/dL — ABNORMAL HIGH (ref 70–99)
Glucose-Capillary: 177 mg/dL — ABNORMAL HIGH (ref 70–99)
Glucose-Capillary: 189 mg/dL — ABNORMAL HIGH (ref 70–99)
Glucose-Capillary: 192 mg/dL — ABNORMAL HIGH (ref 70–99)
Glucose-Capillary: 242 mg/dL — ABNORMAL HIGH (ref 70–99)

## 2010-07-25 ENCOUNTER — Inpatient Hospital Stay
Admission: AD | Admit: 2010-07-25 | Discharge: 2010-08-15 | Disposition: A | Discharge status: Home / Self Care | Payer: Self-pay | Source: Ambulatory Visit | Attending: Internal Medicine | Admitting: Internal Medicine

## 2010-07-25 ENCOUNTER — Inpatient Hospital Stay (HOSPITAL_COMMUNITY): Payer: Medicare Other

## 2010-07-25 LAB — BASIC METABOLIC PANEL
BUN: 45 mg/dL — ABNORMAL HIGH (ref 6–23)
CO2: 31 mEq/L (ref 19–32)
Chloride: 103 mEq/L (ref 96–112)
Creatinine, Ser: 0.54 mg/dL (ref 0.4–1.2)
Glucose, Bld: 178 mg/dL — ABNORMAL HIGH (ref 70–99)

## 2010-07-25 LAB — CBC
Hemoglobin: 7.3 g/dL — ABNORMAL LOW (ref 12.0–15.0)
MCH: 28.9 pg (ref 26.0–34.0)
MCV: 87.7 fL (ref 78.0–100.0)
RBC: 2.53 MIL/uL — ABNORMAL LOW (ref 3.87–5.11)

## 2010-07-25 LAB — HEMOGLOBIN AND HEMATOCRIT, BLOOD: Hemoglobin: 7.8 g/dL — ABNORMAL LOW (ref 12.0–15.0)

## 2010-07-25 LAB — GLUCOSE, CAPILLARY

## 2010-07-25 NOTE — Discharge Summary (Addendum)
[name] Kent, [name] Kent NO.:  [id number]  MEDICAL RECORD NO.:  [id number]           PATIENT TYPE:  I  LOCATION:  2606                         FACILITY:  [facility]  PHYSICIAN:  Oley Balm. Sung Amabile, MD   DATE OF BIRTH:  04-16-1950  DATE OF ADMISSION:  07/20/2010 DATE OF DISCHARGE:  07/25/2010                              DISCHARGE SUMMARY   DISCHARGE DIAGNOSES: 1. Chronic respiratory failure secondary to prolonged critical     illness. 2. Atrial fibrillation with RVR. 3. Acute retroperitoneal bleed. 4. Hyperglycemia.  LINES/TUBES: 1. A #8 tracheostomy which was placed on June 25, 2010 at [facility], was changed to a #6 cuff trache on Jul 26, 2010. 2. PICC line, which was placed since prior to admission to [facility]     and was removed on Jul 24, 2010. 3. Left subclavian triple-lumen catheter was placed on Jul 26, 2010,     and will remain in place [facility]. 4. Antibiotic data, prior to transfer [name] Kent was on Merrem,     Levaquin, and vancomycin.  These were all discontinued upon     admission.  She did not receive any antibiotic therapy during     hospital course.  She did have noted leukocytosis, but this was     secondary to retroperitoneal bleed.  No acute evidence of infection     at this time.  CULTURE DATA: 1. July 10, 2010, respiratory culture demonstrates a pansensitive     Klebsiella. 2. July 16, 2010, blood cultures are negative. 3. Jul 22, 2010, blood cultures demonstrate no growth to date at the     time of dictation, final results pending.  Best practice; the patient was placed on SCDs for DVT prophylaxis and an H2 blocker for DVT for stress ulcer prevention.  PROTOCOLS:  The patient was placed on a hospital sliding scale insulin.  KEY EVENTS: 1. On July 21, 2010, the patient was noted to remain on pressors.     Continued to have AFib with RVR. 2. On Jul 23, 2010, the patient was weaned off vasopressors and  had     bilateral lower extremity Dopplers negative for DVT.  HISTORY OF PRESENT ILLNESS:  [name] Kent is a 60 year old white female who was admitted to [facility] following an acute drop in her hemoglobin.  Prior to [facility], she was admitted to [facility] on June 16, 2010, with altered mental status.  She was diagnosed with encephalopathy of unclear etiology and was treated for presumptive coag-negative staph bacteremia and meningitis versus encephalitis.  Secondary to prolonged intubation and poor weaning efforts, she did undergo a tracheostomy placement on June 25, 2010, and percutaneous endoscopic gastrotomy tube placement on June 26, 2010.  Noted on the 29th, she did have an acute drop in her hemoglobin and had previously been receiving anticoagulation for atrial fibrillation.  Her hemoglobin was noted to drop from 10 to 7 and a CT of the abdomen and pelvis was obtained, which showed a large retroperitoneal bleed.  She  was transferred to [facility] for further acute care.  She received upon admission 4 units of FFP, 3 units of packed red blood cells, and DDAVP.  She was requiring low-dose vasoactive agents initially on admission.  These were weaned off on Jul 23, 2010.  During hospitalization, she also was noted have acute renal failure in the setting of retroperitoneal bleed.  Currently, acute renal failure has resolved.  At the time of dictation, [name] Kent has been tolerating approximately 6 hours of weaning on trach collar 35%.  On Jul 24, 2010, she did go back on full support after approximately 6 hours. Today, Jul 25, 2010, she has currently anticipated to wean approximately 8 hours on trach collar with placement back on full support at night. She does continue to have low lung volumes on chest x-ray and will likely need nocturnal vent support for quite some time given that she does have a large abdomen and  recent retroperitoneal bleed.  There is also some question of underlying obstructive sleep apnea as well.  [name] Kent was prednisone prior to presentation at [facility] for presumed pruritus at [facility].  It is unclear if she has an underlying need for continued prednisone, however, she has been transitioned back to prednisone 5 mg p.o. daily.  This needs to be further reviewed and possibly weaned to off.  HOSPITAL COURSE BY DISCHARGE DIAGNOSES: 1. Chronic respiratory failure secondary to prolonged critical illness     and acute retroperitoneal bleed.  As per HPI, [name] Kent left has     had an extensive hospitalizations since June 16, 2010.  She     originally was admitted to [facility] with altered mental     status and was treated for coag-negative bacteremia.  She underwent     tracheostomy placement and PEG placement secondary to poor weaning     efforts and [facility].  Unfortunately, at     [facility] she was on continued Coumadin therapy for atrial     fibrillation and unfortunately had a drop in her hemoglobin from 10     to 7 on July 20, 2010, requiring transfer to [facility] for     further evaluation.  CT of the abdomen did demonstrate at that time     a large retroperitoneal bleed.  Post stabilization regarding     bleeding, she was able to wean approximately 6 hours on aerosolized     trach collar.  Today, it is hopeful that she possibly will be able     to wean on trach collar for 6-8 hours with full support at night.     Again, she will likely need nocturnal support for quite some time     given recent bleed and large abdomen with low lung volumes noted on     chest film.  She also does need aggressive physical therapy and     occupational therapy.  She on Jul 25, 2010, received one dose of     Lasix 40 mg with supplemental potassium.  Chest x-ray will need to     be reviewed in the a.m. for changes. 2. Atrial fibrillation with RVR.   As per HPI, [name] Kent has a known     history of atrial fibrillation with recent retroperitoneal bleed in     the setting of anticoagulation.  It is recommended that she never     receive any further anticoagulation due to significant life  threatening bleed.  She currently is rate controlled on digoxin and     will need a digoxin level on the a.m. of Jul 26, 2010. 3. Acute of retroperitoneal bleed in the setting of anticoagulation     therapy.  Ms. Ranker unfortunately did have a large retroperitoneal     bleed with significant drop in hemoglobin noted on July 20, 2010.     She did receive multiple blood products and DDAVP for     stabilization.  Currently, her hemoglobin is 7.3, which is up from     7.0.  No evidence of active bleeding noted at this time.  She also     was noted to have a drop in platelets, but this is felt likely     conceptive.  She did undergo bilateral lower extremity Dopplers     which were negative for DVT, thus no indication for placement of     filter at this time.  It is recommended we continue to monitor     hemoglobin and hematocrit and transfusion only for hemoglobin less     than 7% or active bleeding. 4. Hyperglycemia in the setting of corticosteroid use/diabetes     mellitus.  Ms. Rozman does have a known medical history of diabetes     mellitus with Charcot foot associated changes.  She does indicate     that she was ambulatory at home prior to presentation at [facility]     and is anxious to begin rehab therapy.  She will continue on     sliding scale insulin post discharge and prednisone 5 mg daily.  It     is recommended that prednisone be reviewed for possible     discontinuation.  DISCHARGE INSTRUCTIONS: 1. Activity as tolerated. 2. Diet.  It is recommended that she continue with tube feeding as     below.  However, she has been changed to a #6 tracheostomy as of     Jul 25, 2010, and would like to see Ms. Camilo undergo speech     therapy  evaluation for Passy-Muir valve use as well as swallowing     evaluation.  DISCHARGE MEDICATIONS: 1. Lanoxin 0.125 mg per tube daily. 2. Ventolin 2.5 mg inhaled q.6 h. 3. Depakene 250 mg per tube b.i.d. 4. Ambien 5 mg p.o. at bedtime. 5. Pepcid 20 mg per tube b.i.d. 6. Normal saline flush 10 mL to all 3 ports of the triple-lumen     catheter q.12 h. 7. ProSource Liquid No Carb liquid supplement 30 mL per tube q.i.d. 8. Osmolite 1.5 at 30 mL an hour. 9. Lantus 20 units subcu at bedtime. 10.NovoLog sliding scale insulin with resistance scale. 11.Prednisone 5 mg per tube daily. 12.Vitamin B1 100 mg per tube daily. 13.Synthroid 25 mcg per tube daily.  FOLLOWUP:  Ms. Abaya is pending discharge to [facility]. She will follow up post discharge per instructions of [facility].  DISPOSITION AT TIME OF DISCHARGE:  Ms. Consuegra has met maximum benefit of inpatient therapy and is currently stable for transfer back to [facility].  Time spent on disposition, greater than 45 minutes.     Canary Brim, NP   ______________________________ Oley Balm Sung Amabile, MD    BO/MEDQ  D:  07/25/2010  T:  07/25/2010  Job:  [id number]  cc:   [facility]  Electronically Signed by Canary Brim  on 07/25/2010 02:25:31 PM Electronically Signed by Billy Fischer MD on 07/28/2010 04:24:08 AM

## 2010-07-26 LAB — BASIC METABOLIC PANEL
BUN: 41 mg/dL — ABNORMAL HIGH (ref 6–23)
GFR calc non Af Amer: >60 mL/min (ref >60)
Glucose, Bld: 230 mg/dL — ABNORMAL HIGH (ref 70–99)
Potassium: 4.5 mEq/L (ref 3.5–5.1)

## 2010-07-26 LAB — CBC
MCH: 27.2 pg (ref 26.0–34.0)
MCV: 88.3 fL (ref 78.0–100.0)
Platelets: 167 10*3/uL (ref 150–400)
RBC: 2.9 MIL/uL — ABNORMAL LOW (ref 3.87–5.11)

## 2010-07-26 LAB — HEMOGLOBIN AND HEMATOCRIT, BLOOD: HCT: 26.3 % — ABNORMAL LOW (ref 36.0–46.0)

## 2010-07-27 LAB — HEMOGLOBIN AND HEMATOCRIT, BLOOD
HCT: 25.7 % — ABNORMAL LOW (ref 36.0–46.0)
Hemoglobin: 8.2 g/dL — ABNORMAL LOW (ref 12.0–15.0)

## 2010-07-28 LAB — CALCIUM, IONIZED: Calcium, Ion: 1.24 mmol/L (ref 1.12–1.32)

## 2010-07-28 LAB — BASIC METABOLIC PANEL
BUN: 37 mg/dL — ABNORMAL HIGH (ref 6–23)
CO2: 33 mEq/L — ABNORMAL HIGH (ref 19–32)
Chloride: 95 mEq/L — ABNORMAL LOW (ref 96–112)
Glucose, Bld: 227 mg/dL — ABNORMAL HIGH (ref 70–99)
Potassium: 5.6 mEq/L — ABNORMAL HIGH (ref 3.5–5.1)

## 2010-07-28 LAB — CULTURE, BLOOD (ROUTINE X 2)
Culture  Setup Time: 201205012302
Culture: NO GROWTH

## 2010-07-28 LAB — HEMOGLOBIN AND HEMATOCRIT, BLOOD: Hemoglobin: 8.4 g/dL — ABNORMAL LOW (ref 12.0–15.0)

## 2010-07-28 LAB — TSH: TSH: 7.131 u[IU]/mL — ABNORMAL HIGH (ref 0.350–4.500)

## 2010-07-29 LAB — TYPE AND SCREEN
ABO/RH(D): A POS
ABO/RH(D): A POS
Antibody Screen: NEGATIVE
Unit division: 0

## 2010-07-29 LAB — HEMOGLOBIN AND HEMATOCRIT, BLOOD: HCT: 26.3 % — ABNORMAL LOW (ref 36.0–46.0)

## 2010-07-30 ENCOUNTER — Other Ambulatory Visit (HOSPITAL_COMMUNITY): Payer: Self-pay

## 2010-07-30 ENCOUNTER — Other Ambulatory Visit (HOSPITAL_COMMUNITY): Payer: Medicare Other | Attending: Internal Medicine

## 2010-07-30 LAB — BASIC METABOLIC PANEL
BUN: 28 mg/dL — ABNORMAL HIGH (ref 6–23)
Chloride: 98 mEq/L (ref 96–112)
Creatinine, Ser: <0.47 mg/dL (ref 0.4–1.2)
Potassium: 3.7 mEq/L (ref 3.5–5.1)

## 2010-07-30 LAB — HEMOGLOBIN AND HEMATOCRIT, BLOOD
HCT: 29.4 % — ABNORMAL LOW (ref 36.0–46.0)
Hemoglobin: 9.1 g/dL — ABNORMAL LOW (ref 12.0–15.0)

## 2010-07-30 LAB — MAGNESIUM: Magnesium: 1.9 mg/dL (ref 1.5–2.5)

## 2010-07-31 LAB — HEMOGLOBIN AND HEMATOCRIT, BLOOD: Hemoglobin: 9.6 g/dL — ABNORMAL LOW (ref 12.0–15.0)

## 2010-07-31 LAB — TYPE AND SCREEN
ABO/RH(D): A POS
Antibody Screen: NEGATIVE

## 2010-08-01 ENCOUNTER — Other Ambulatory Visit (HOSPITAL_COMMUNITY): Payer: Medicare Other | Attending: Internal Medicine

## 2010-08-03 LAB — CBC
Hemoglobin: 9 g/dL — ABNORMAL LOW (ref 12.0–15.0)
MCH: 26.9 pg (ref 26.0–34.0)
MCHC: 31.1 g/dL (ref 30.0–36.0)
MCV: 86.5 fL (ref 78.0–100.0)
RBC: 3.34 MIL/uL — ABNORMAL LOW (ref 3.87–5.11)

## 2010-08-03 LAB — BASIC METABOLIC PANEL
BUN: 27 mg/dL — ABNORMAL HIGH (ref 6–23)
CO2: 28 mEq/L (ref 19–32)
Calcium: 9.2 mg/dL (ref 8.4–10.5)
Chloride: 99 mEq/L (ref 96–112)
Creatinine, Ser: 0.67 mg/dL (ref 0.4–1.2)
GFR calc Af Amer: >60 mL/min (ref >60)

## 2010-08-05 LAB — HEMOGLOBIN AND HEMATOCRIT, BLOOD
HCT: 29.7 % — ABNORMAL LOW (ref 36.0–46.0)
Hemoglobin: 9.1 g/dL — ABNORMAL LOW (ref 12.0–15.0)

## 2010-08-06 LAB — TYPE AND SCREEN
ABO/RH(D): A POS
Antibody Screen: NEGATIVE

## 2010-08-07 ENCOUNTER — Other Ambulatory Visit (HOSPITAL_COMMUNITY): Payer: Medicare Other | Attending: Internal Medicine

## 2010-08-07 LAB — BASIC METABOLIC PANEL
BUN: 20 mg/dL (ref 6–23)
Calcium: 8.8 mg/dL (ref 8.4–10.5)
Creatinine, Ser: 0.75 mg/dL (ref 0.4–1.2)
GFR calc Af Amer: >60 mL/min (ref >60)
GFR calc non Af Amer: >60 mL/min (ref >60)

## 2010-08-07 LAB — MAGNESIUM: Magnesium: 2 mg/dL (ref 1.5–2.5)

## 2010-08-09 LAB — BASIC METABOLIC PANEL
Calcium: 9 mg/dL (ref 8.4–10.5)
GFR calc Af Amer: >60 mL/min (ref >60)
GFR calc non Af Amer: >60 mL/min (ref >60)
Glucose, Bld: 251 mg/dL — ABNORMAL HIGH (ref 70–99)
Sodium: 135 mEq/L (ref 135–145)

## 2010-08-09 LAB — HEPATIC FUNCTION PANEL
AST: 18 U/L (ref 0–37)
Bilirubin, Direct: 1.1 mg/dL — ABNORMAL HIGH (ref 0.0–0.3)
Total Protein: 6.4 g/dL (ref 6.0–8.3)

## 2010-08-09 LAB — CBC
HCT: 29.8 % — ABNORMAL LOW (ref 36.0–46.0)
MCHC: 31.2 g/dL (ref 30.0–36.0)
Platelets: 192 10*3/uL (ref 150–400)
RDW: 16.4 % — ABNORMAL HIGH (ref 11.5–15.5)
WBC: 9.2 10*3/uL (ref 4.0–10.5)

## 2010-08-10 ENCOUNTER — Other Ambulatory Visit (HOSPITAL_COMMUNITY): Payer: Medicare Other

## 2010-08-11 ENCOUNTER — Other Ambulatory Visit (HOSPITAL_COMMUNITY): Payer: Medicare Other | Attending: Internal Medicine

## 2010-08-11 ENCOUNTER — Other Ambulatory Visit (HOSPITAL_COMMUNITY): Payer: Self-pay

## 2010-08-11 LAB — HEMOGLOBIN AND HEMATOCRIT, BLOOD
HCT: 34.5 % — ABNORMAL LOW (ref 36.0–46.0)
Hemoglobin: 10.7 g/dL — ABNORMAL LOW (ref 12.0–15.0)

## 2010-08-11 LAB — PROTIME-INR: Prothrombin Time: 14.2 seconds (ref 11.6–15.2)

## 2010-08-12 LAB — DIGOXIN LEVEL: Digoxin Level: <0.3 ng/mL — ABNORMAL LOW (ref 0.8–2.0)

## 2010-08-12 LAB — BASIC METABOLIC PANEL
Calcium: 9 mg/dL (ref 8.4–10.5)
GFR calc Af Amer: >60 mL/min (ref >60)
GFR calc non Af Amer: >60 mL/min (ref >60)
Sodium: 137 mEq/L (ref 135–145)

## 2010-08-12 LAB — DIFFERENTIAL
Basophils Relative: 2 % — ABNORMAL HIGH (ref 0–1)
Eosinophils Absolute: 0.2 10*3/uL (ref 0.0–0.7)
Monocytes Absolute: 0.7 10*3/uL (ref 0.1–1.0)
Monocytes Relative: 11 % (ref 3–12)
Neutrophils Relative %: 61 % (ref 43–77)

## 2010-08-12 LAB — CBC
Hemoglobin: 10.1 g/dL — ABNORMAL LOW (ref 12.0–15.0)
MCHC: 30.6 g/dL (ref 30.0–36.0)
Platelets: 225 10*3/uL (ref 150–400)
RDW: 16.8 % — ABNORMAL HIGH (ref 11.5–15.5)

## 2010-08-12 LAB — PHENYTOIN LEVEL, TOTAL: Phenytoin Lvl: 5.4 ug/mL — ABNORMAL LOW (ref 10.0–20.0)

## 2010-08-12 LAB — TYPE AND SCREEN: ABO/RH(D): A POS

## 2010-08-15 LAB — BASIC METABOLIC PANEL
BUN: 18 mg/dL (ref 6–23)
CO2: 24 mEq/L (ref 19–32)
Calcium: 9.2 mg/dL (ref 8.4–10.5)
GFR calc non Af Amer: >60 mL/min (ref >60)
Glucose, Bld: 212 mg/dL — ABNORMAL HIGH (ref 70–99)

## 2010-08-15 LAB — CBC
HCT: 31.7 % — ABNORMAL LOW (ref 36.0–46.0)
Hemoglobin: 9.7 g/dL — ABNORMAL LOW (ref 12.0–15.0)
MCHC: 30.6 g/dL (ref 30.0–36.0)
MCV: 84.8 fL (ref 78.0–100.0)
RDW: 17.4 % — ABNORMAL HIGH (ref 11.5–15.5)

## 2010-10-16 ENCOUNTER — Ambulatory Visit: Payer: Medicare Other | Admitting: Cardiovascular Disease

## 2010-10-30 ENCOUNTER — Ambulatory Visit: Payer: Medicare Other | Admitting: Cardiovascular Disease

## 2011-07-16 IMAGING — CR DG CHEST 1V PORT
2 series · 2 of 2 positions shown · non-contrast
Comparison: 07/20/2010

CLINICAL DATA: Respiratory distress

PORTABLE CHEST - 1 VIEW

[AP (1 of 2)]
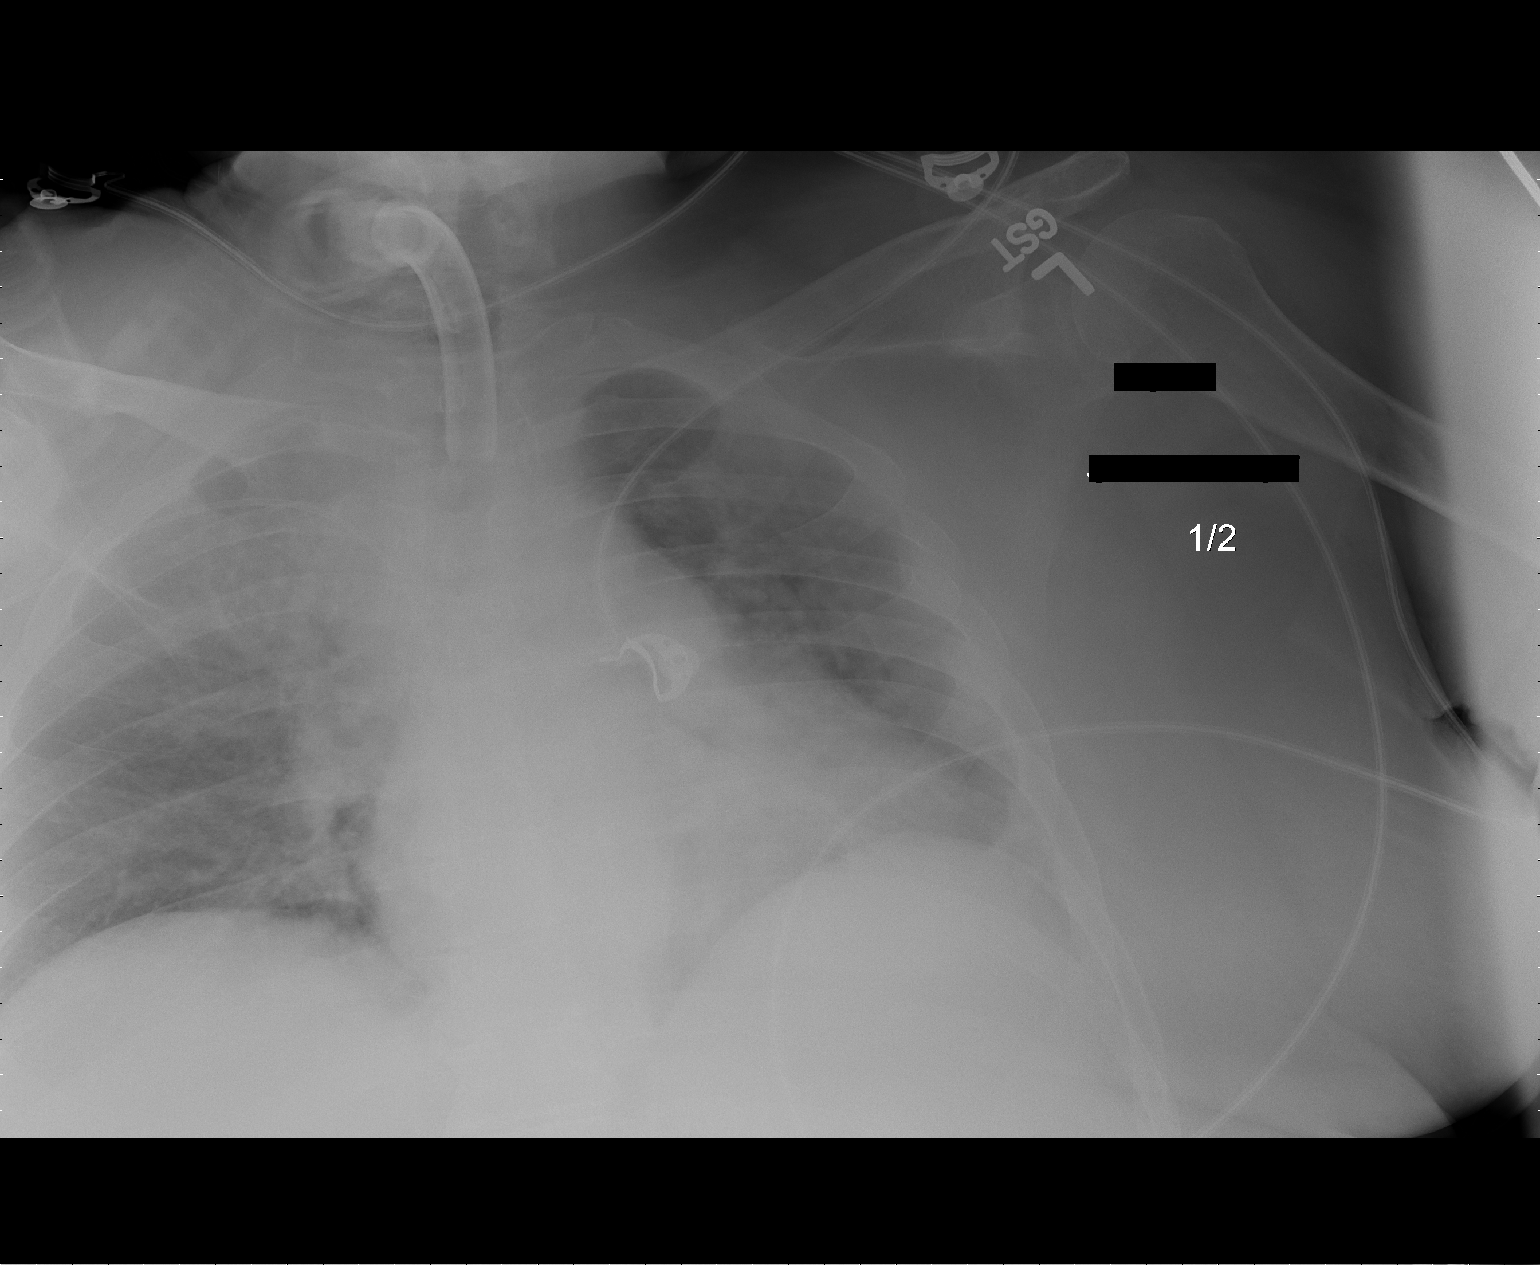

[AP (2 of 2)]
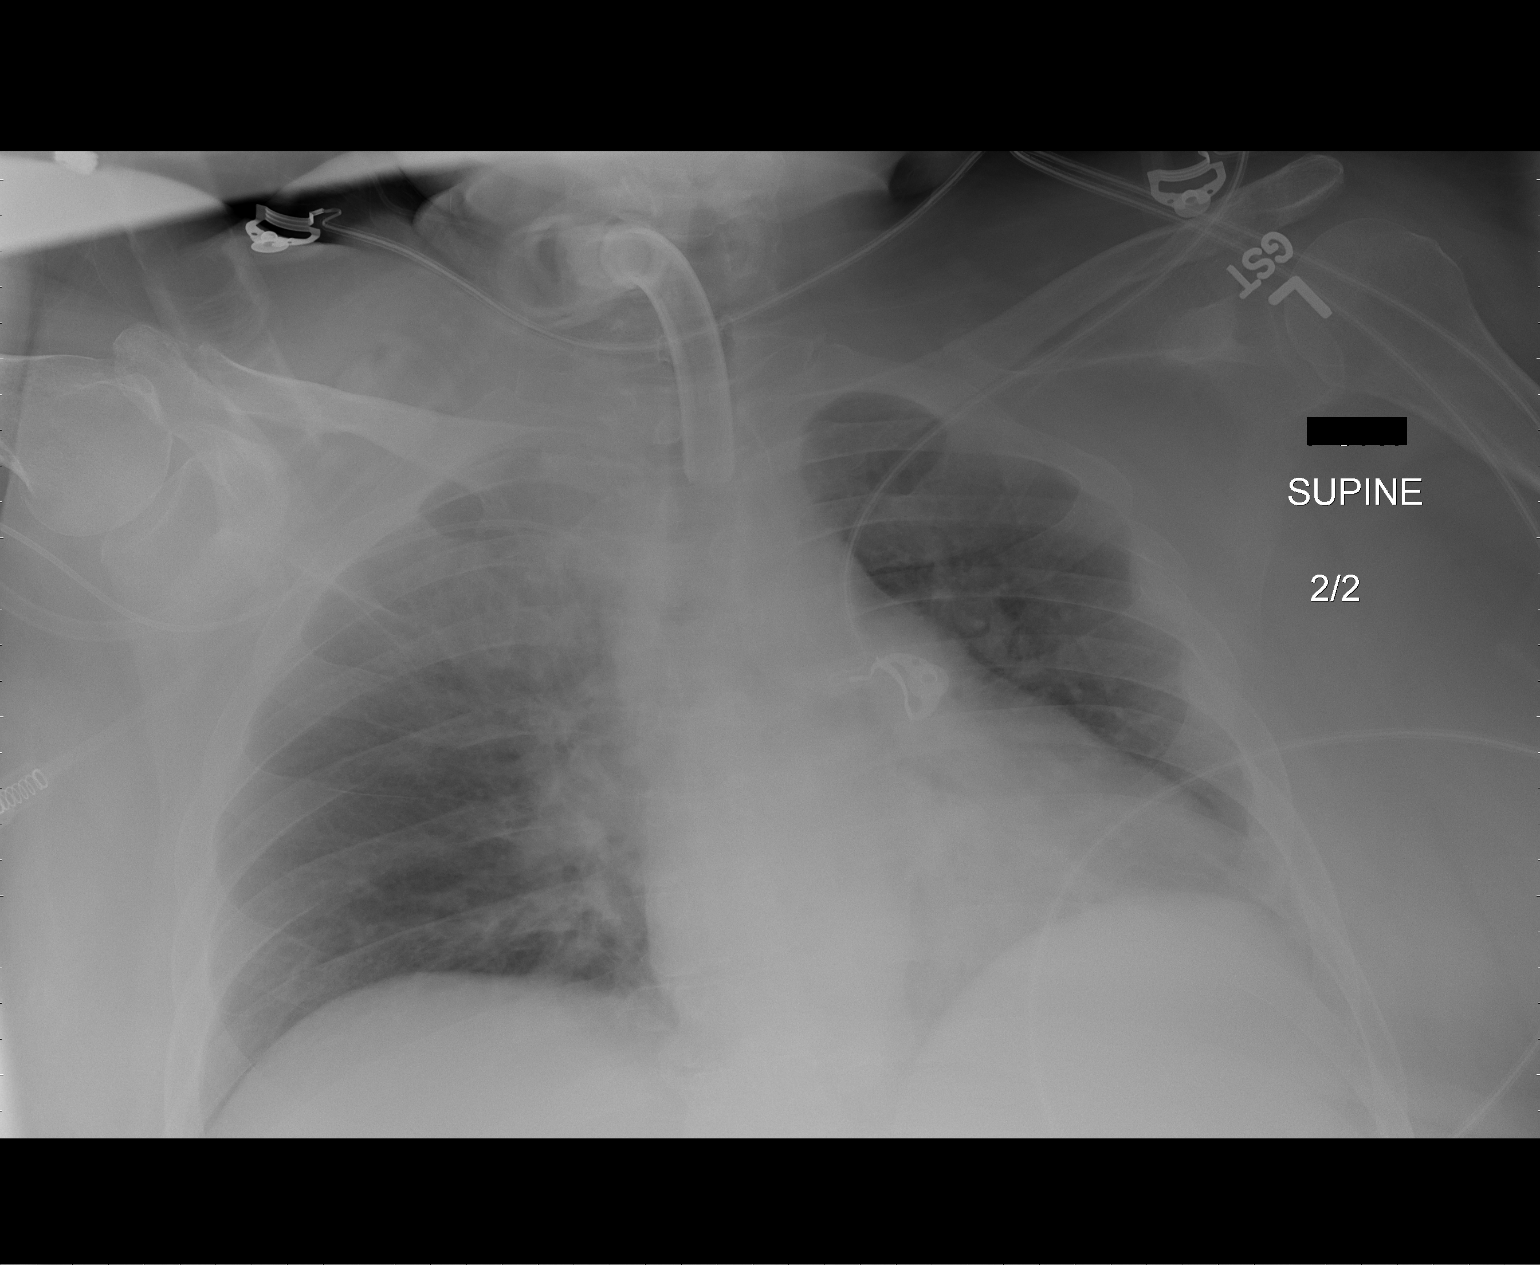

[2 of 2 positions shown; findings below may reference images not displayed]

FINDINGS: There is a tracheostomy tube with tip above the carina.

There is a right arm PICC line with tip in the cavoatrial junction.

Heart size is mildly enlarged.

There are low lung volumes.

There is asymmetric opacification within the right upper lobe which
appears new from previous exam.

Atelectasis is noted in the left base.
IMPRESSION: 1. New right upper lobe airspace opacity.
2.  No change in left base atelectasis and low lung volumes.

## 2011-07-17 IMAGING — CR DG CHEST 1V PORT
1 series · 1 of 1 positions shown · non-contrast
Comparison: Portable chest x-ray of 07/21/2010

CLINICAL DATA: Left central line placement

PORTABLE CHEST - 1 VIEW

[AP]
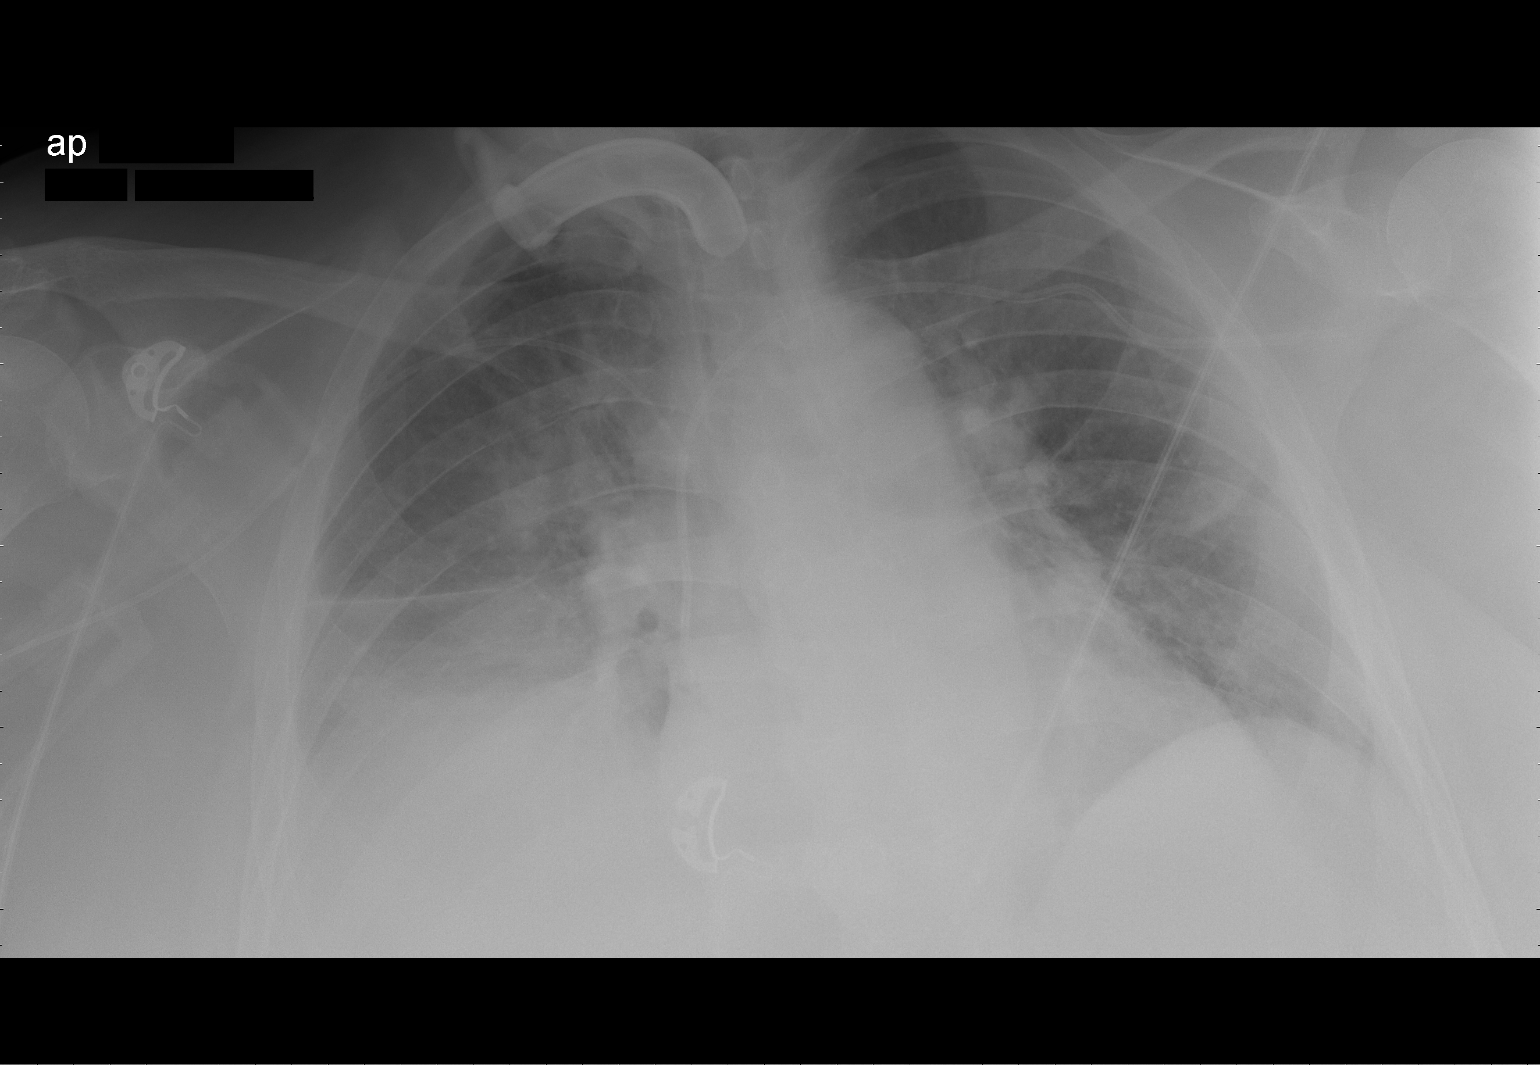

[1 of 1 positions shown; findings below may reference images not displayed]

FINDINGS: A new left central venous line has been placed with the
tip in the lower SVC.  Right central venous line is unchanged.
Tracheostomy remains.  The lungs are not as well aerated with
increasing right basilar atelectasis. Follow-up is recommended to
exclude pneumonia.  Cardiomegaly is stable.
IMPRESSION: 1.  New left central venous line tip in lower SVC.  No
pneumothorax.
2.  Increasing right basilar opacity most consistent with
atelectasis or pneumonia.

## 2011-07-18 IMAGING — CR DG CHEST 1V PORT
1 series · 1 of 1 positions shown · non-contrast
Comparison: Multiple recent previous exams.

CLINICAL DATA: Airspace disease.

PORTABLE CHEST - 1 VIEW

[AP]
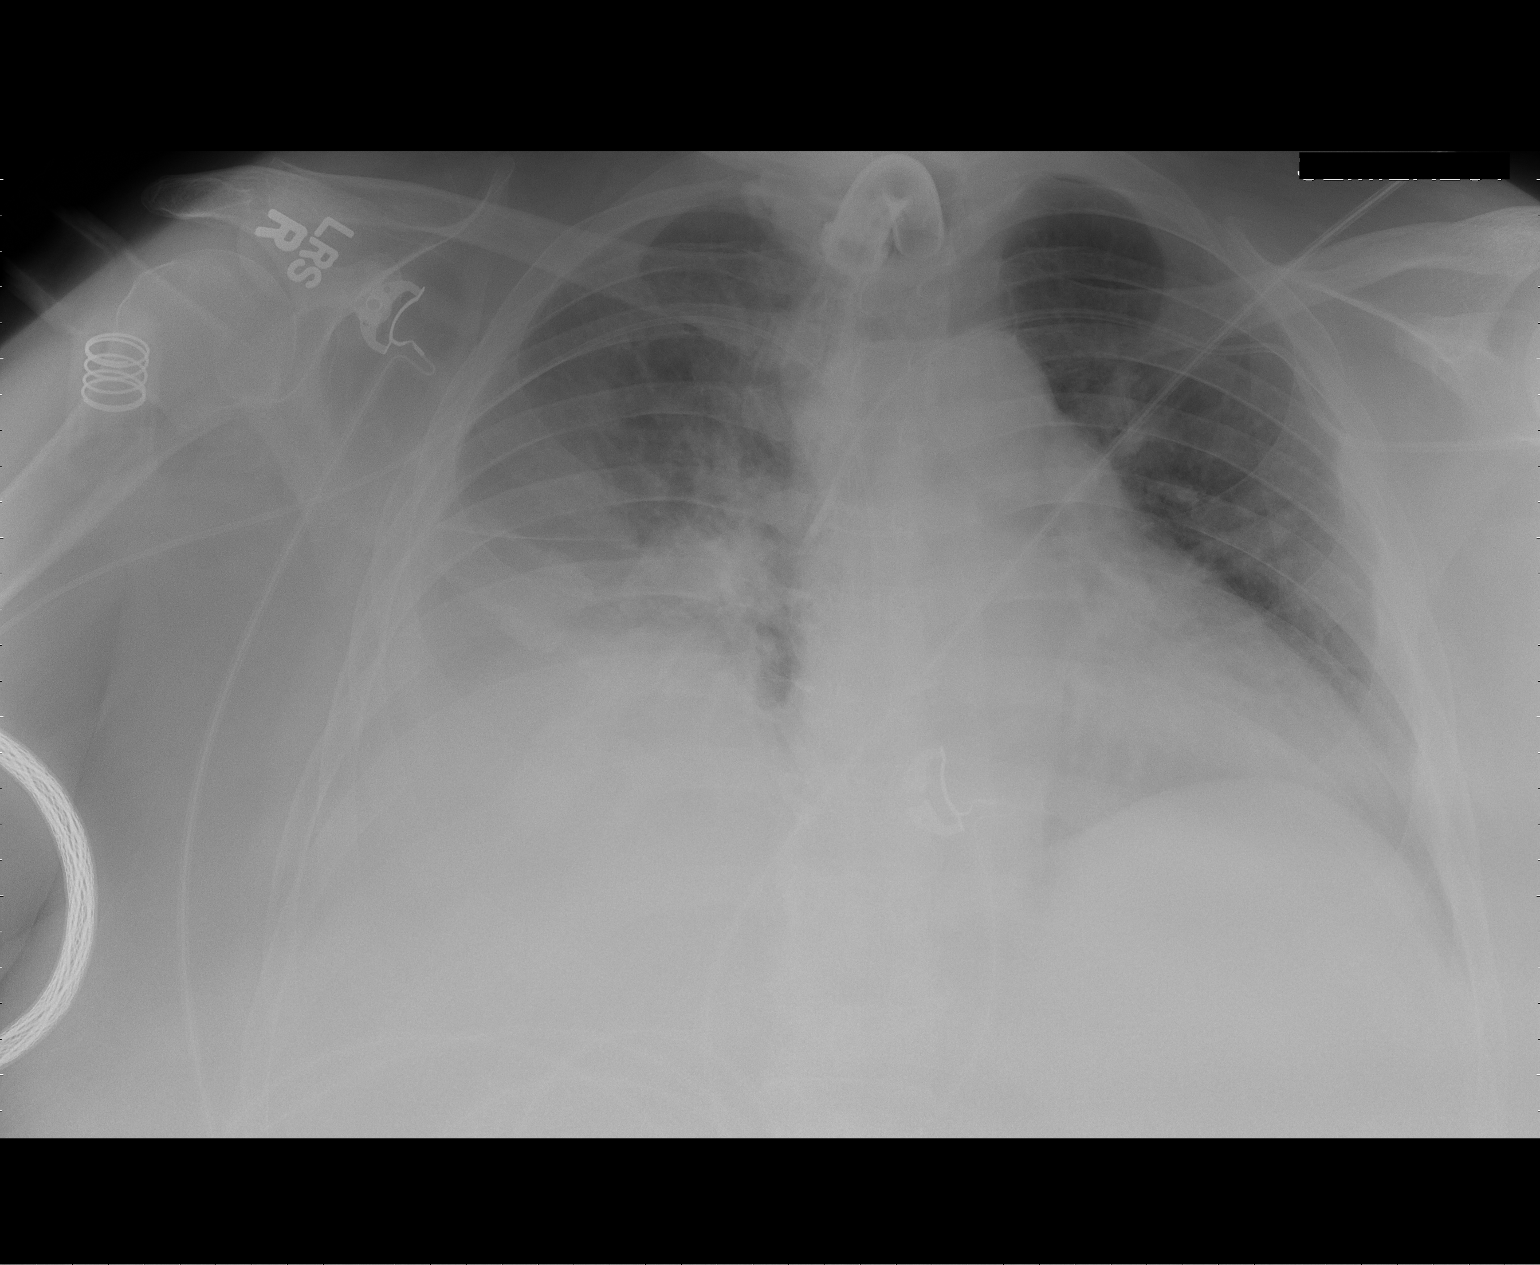

[1 of 1 positions shown; findings below may reference images not displayed]

FINDINGS: 2386 hours.  Lung volumes are low.  Right base collapse /
consolidation is stable to minimally progressed in the interval.
There is some minimal atelectasis at the right base as well.
Tracheostomy tube again noted.  The right PICC line tip projects at
the mid to distal SVC level.  A left subclavian central line tip
projects at the proximal SVC level. Telemetry leads overlie the
chest.
IMPRESSION: No substantial interval change exam.

## 2011-07-19 IMAGING — CR DG CHEST 1V PORT
1 series · 1 of 1 positions shown · non-contrast
Comparison: 07/23/2010 study.

CLINICAL DATA: Respiratory distress.  Evaluation of tracheostomy
tube position.

PORTABLE CHEST - 1 VIEW

[AP]
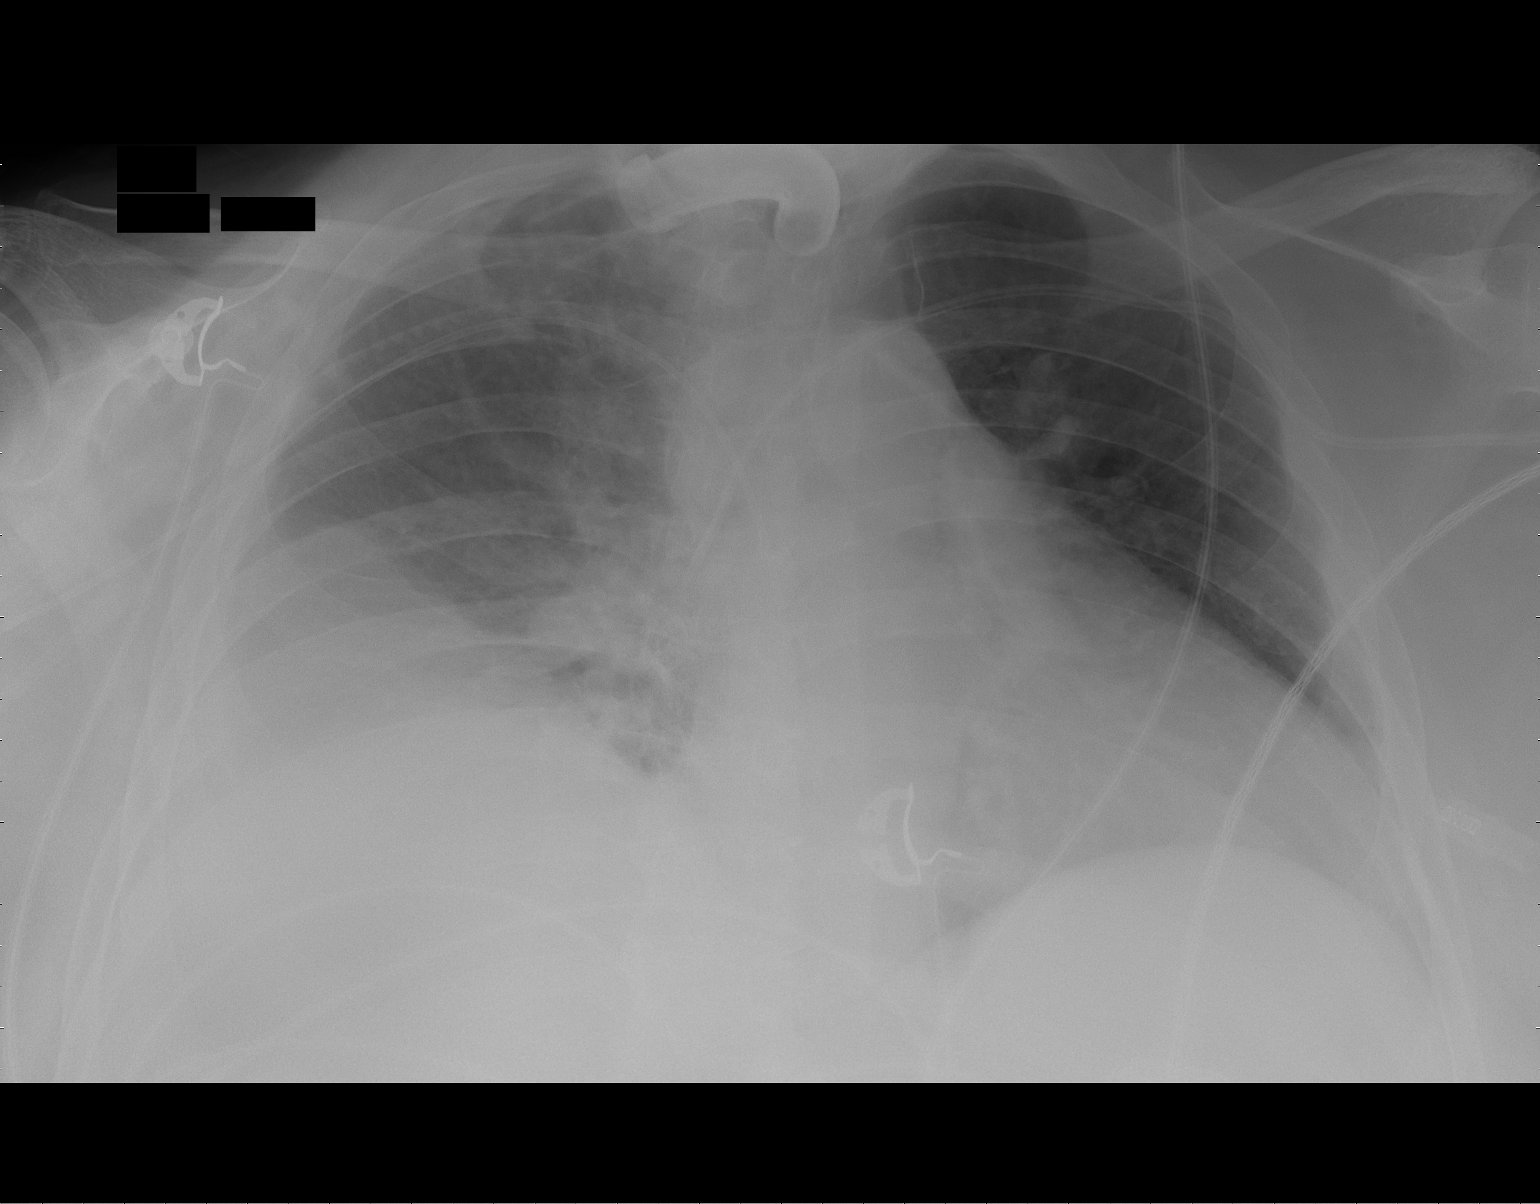

[1 of 1 positions shown; findings below may reference images not displayed]

FINDINGS: Tip of tracheostomy tube terminates 4 cm above the
carina. Tip of left brachiocephalic venous catheter terminates in
the lower inferior vena cava.  No pneumothorax is evident.  Tip of
right brachiocephalic venous catheter terminates in lower superior
vena cava. There is stable mild enlargement of the cardiac
silhouette and mediastinum.  There is elevation of the right
hemidiaphragm with right basilar atelectasis with infiltrative
densities seen in the lower right lung unchanged. There is slight
improved aeration in the right mid and upper lung.  Left lung is
free of infiltrates.  No left pleural effusion is seen.  There may
be a small amount of right pleural effusion.  There is vascular
congestion pattern accentuated by semi-erect positioning.  Slightly
osteopenic appearance of bones.
IMPRESSION: Tip of tracheostomy tube terminates 4 cm above carina.  Venous
catheter positions are detailed above.  No pneumothorax.  Stable
mild enlargement of the cardiac silhouette. There is elevation of
the right hemidiaphragm with right basilar atelectasis with
infiltrative densities seen in the lower right lung unchanged.
There is slight improved aeration in the right mid and upper lung.
There may be a small amount of right pleural effusion.  There is
vascular congestion pattern accentuated by semi-erect positioning.

## 2011-08-06 IMAGING — US US ABDOMEN COMPLETE
1 series · 13 of 25 positions shown · non-contrast
Comparison: Unenhanced CT abdomen pelvis 07/20/2010.  Urinary
tract ultrasound 07/21/2010.

CLINICAL DATA: Abdominal pain.  Follow up right sided
retroperitoneal hematoma.

COMPLETE ABDOMINAL ULTRASOUND 08/11/2010:

[Series 1: us abdomen complete · 0.32mm/px · 13 of 65 slices shown]
[im 1/65]
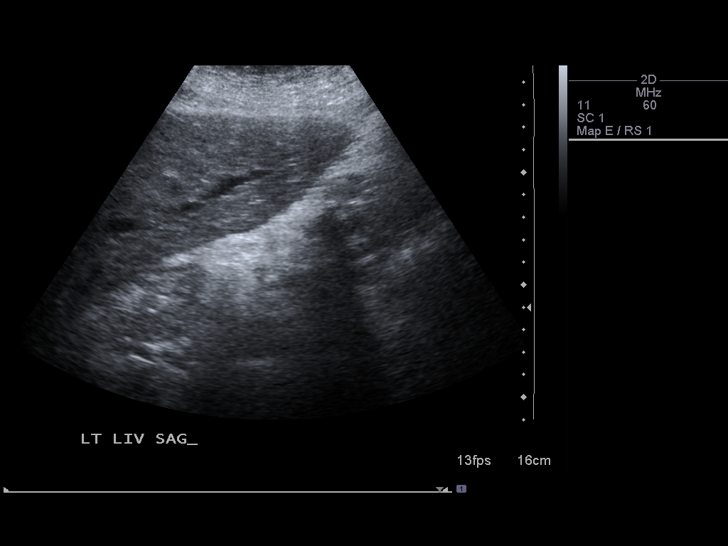
[im 6/65]
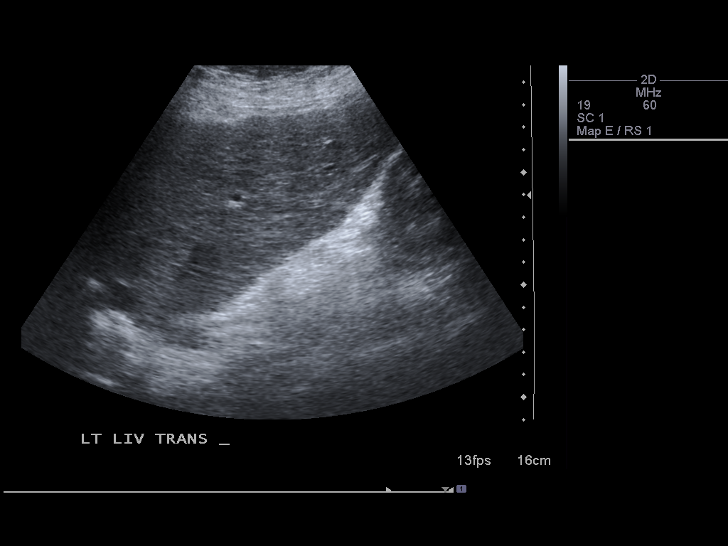
[im 11/65]
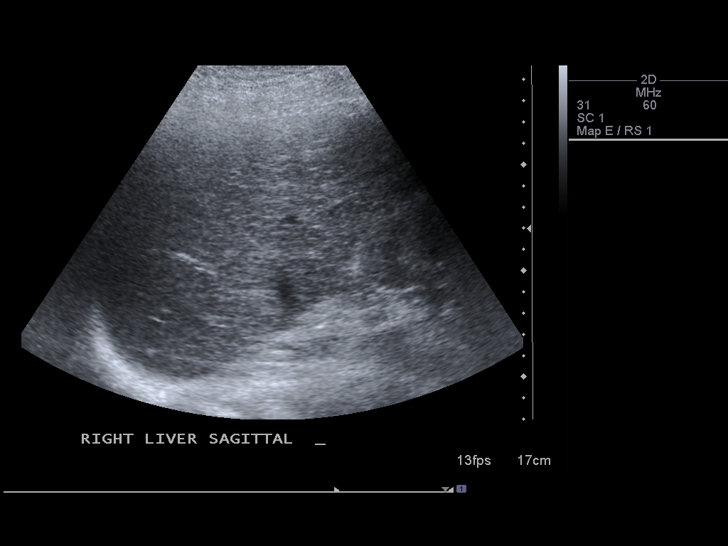
[im 17/65]
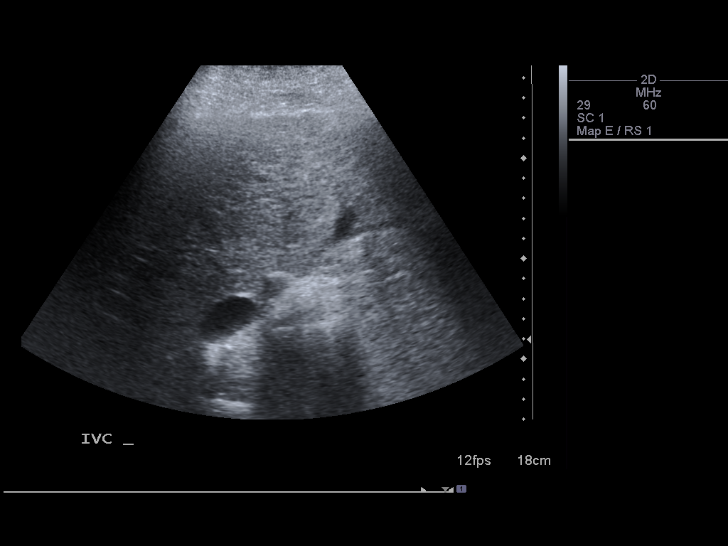
[im 22/65]
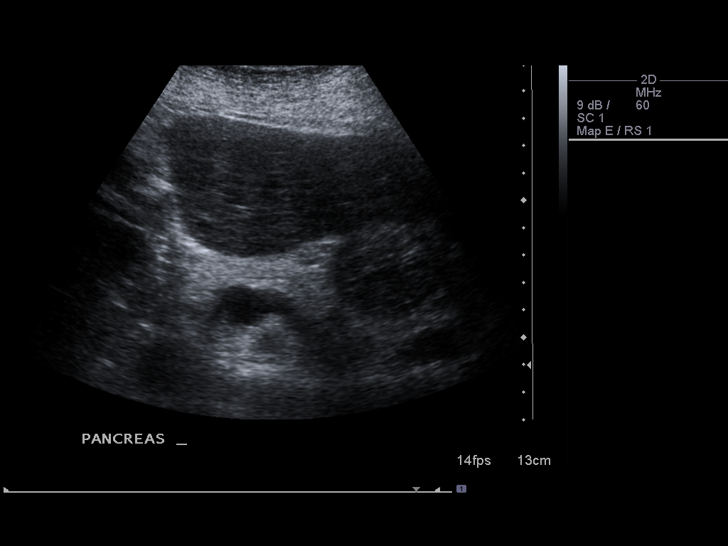
[im 27/65]
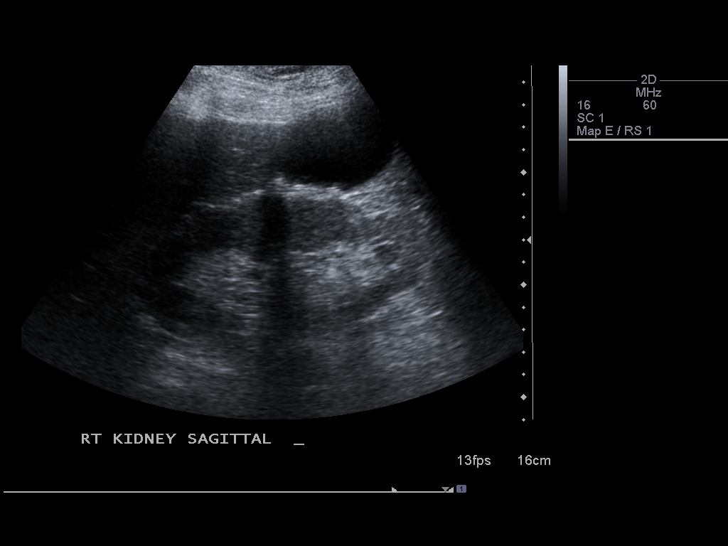
[im 33/65]
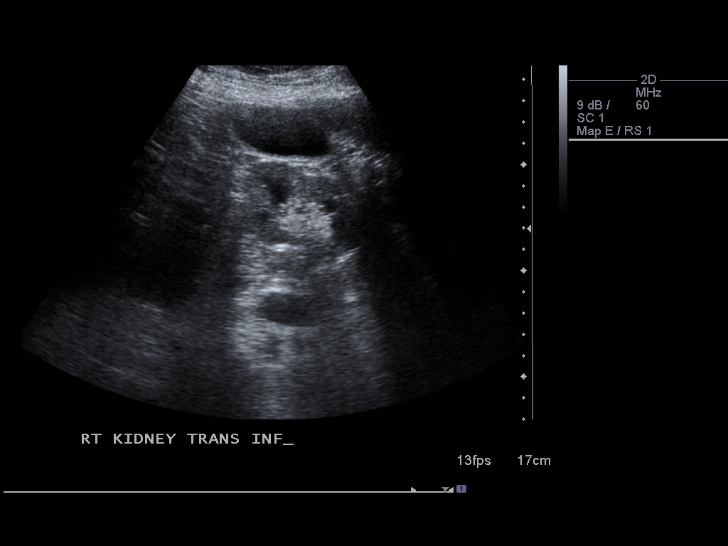
[im 38/65]
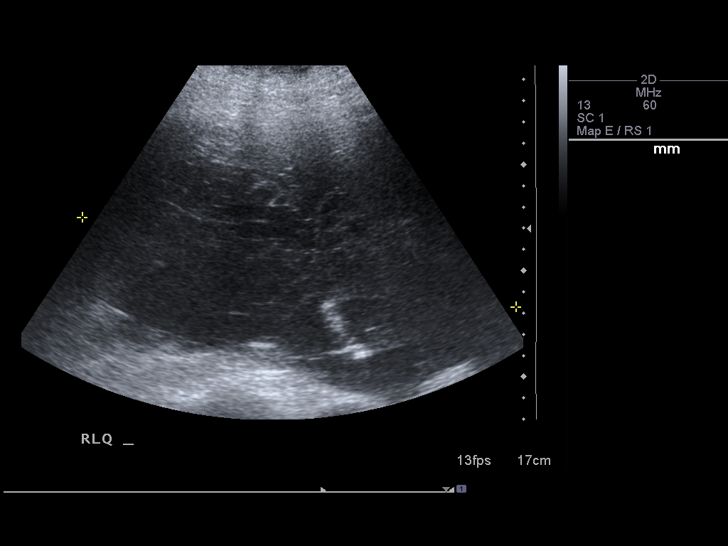
[im 43/65]
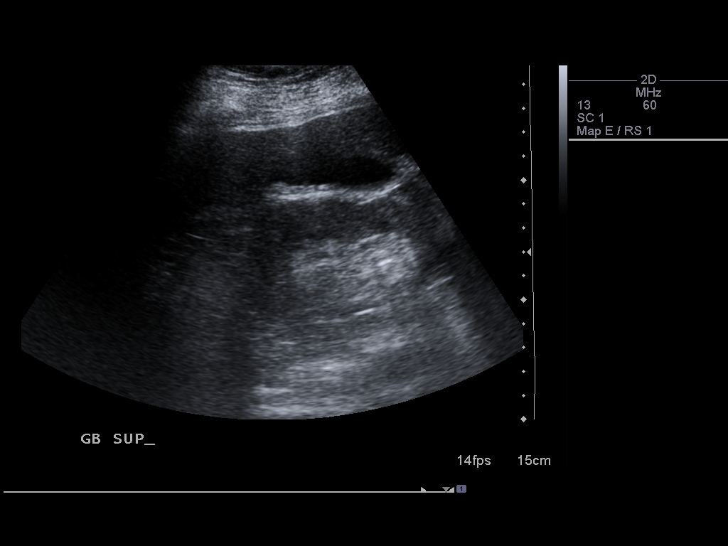
[im 49/65]
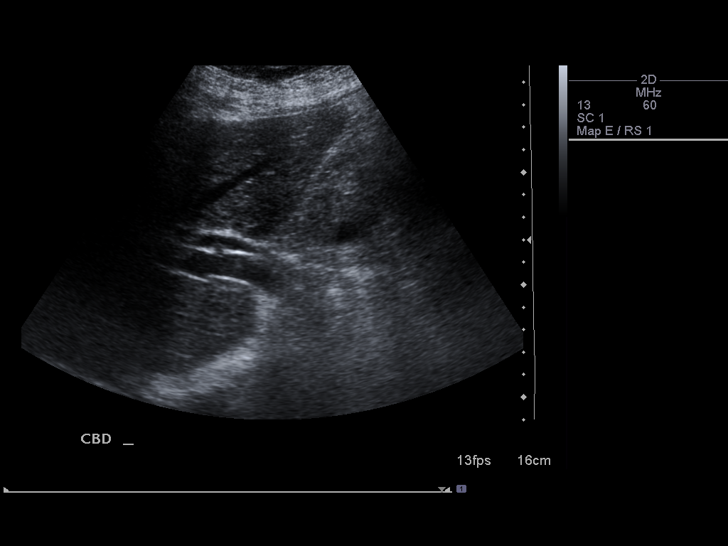
[im 54/65]
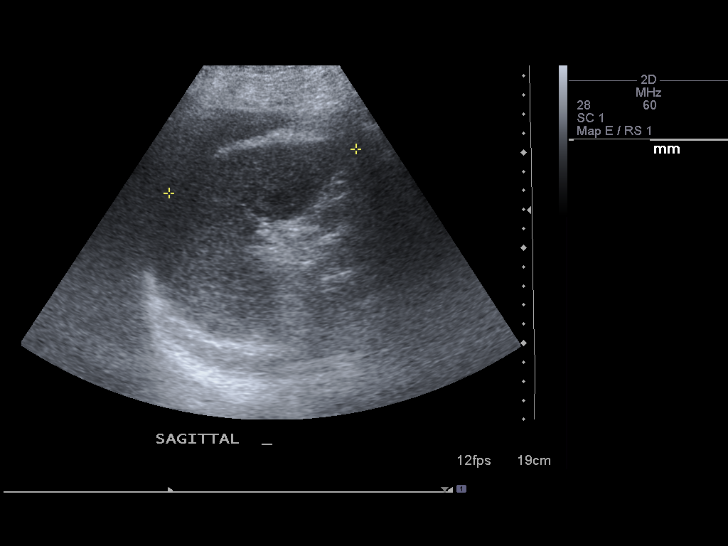
[im 59/65]
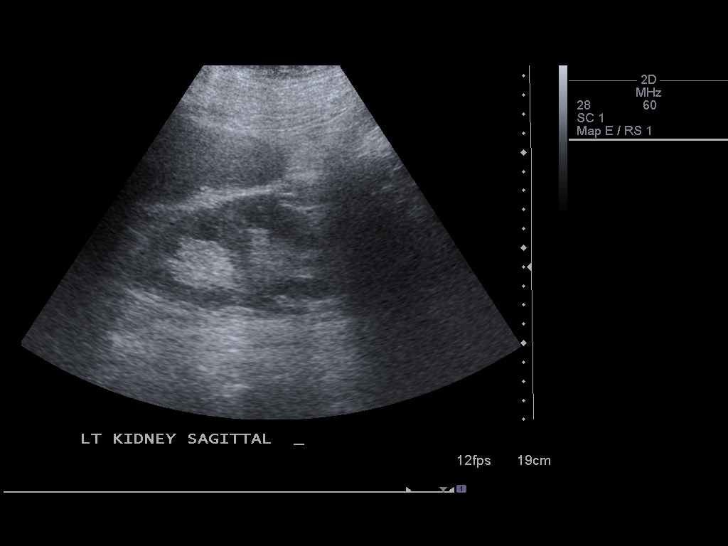
[im 65/65]
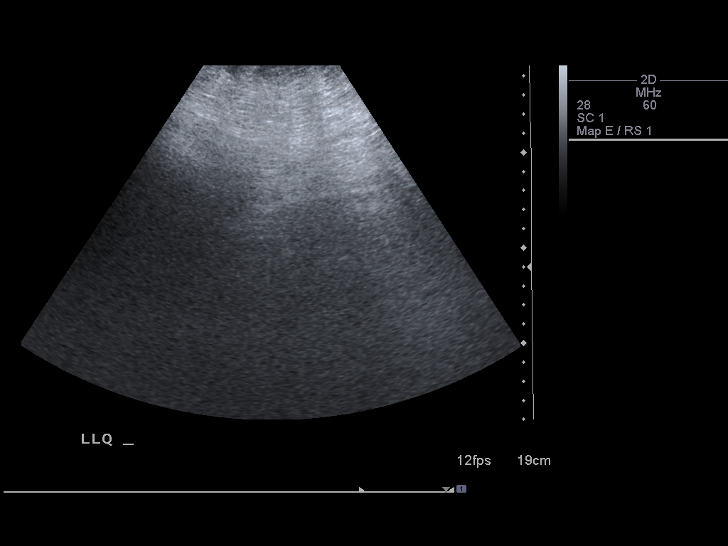

[13 of 25 positions shown; findings below may reference images not displayed]

FINDINGS: Gallbladder:  Numerous tiny shadowing gallstones.  No gallbladder
wall thickening or pericholecystic fluid.  Negative sonographic
Murphy's sign according to the ultrasound technologist.

Common bile duct:  Normal in caliber with maximum diameter
approximating 6 mm.  No visible common duct stones.

Liver:  Diffusely increased and coarsened echotexture without focal
hepatic parenchymal abnormality.  Patent portal vein with
hepatopetal flow.

IVC:  Patent.

Pancreas:  Although the pancreas is difficult to visualize in its
entirety, no focal pancreatic abnormality is identified.

Spleen:  Normal size and echotexture without focal parenchymal
abnormality.

Right Kidney:  No hydronephrosis.  Well-preserved cortex.  No
shadowing calculi.  Normal size and parenchymal echotexture without
focal abnormalities. Approximately 13.5 cm length.

Left Kidney:  No hydronephrosis.  Well-preserved cortex.  No
shadowing calculi.  Normal size and parenchymal echotexture without
focal abnormalities. Approximately 12.6 cm length.  The length on
the prior examination was falsely lobe.

Abdominal aorta:  Normal in caliber throughout its visualized
course in the abdomen without significant atherosclerosis.  The
distal aorta was obscured by overlying bowel gas.

Other findings:  Approximate 21 cm complex fluid collection in the
right retroperitoneum, corresponding to the previously identified
retroperitoneal hematoma, unchanged from the prior CT.
IMPRESSION: 1.  Stable large right sided retroperitoneal hematoma since the CT
07/20/2010.
2.  Cholelithiasis without sonographic evidence of acute
cholecystitis.
3.  Diffuse hepatic steatosis and/or hepatocellular disease without
focal hepatic parenchymal abnormalities.
4.  Otherwise normal abdominal ultrasound with a caveat that the
distal abdominal aorta was obscured by overlying bowel gas.  The
distal abdominal aorta was normal in caliber on the prior
CT.[DATE]

## 2012-05-24 ENCOUNTER — Inpatient Hospital Stay: Payer: Self-pay | Admitting: Orthopedic Surgery

## 2012-05-24 LAB — COMPREHENSIVE METABOLIC PANEL
Albumin: 3.7 g/dL (ref 3.4–5.0)
BUN: 36 mg/dL — ABNORMAL HIGH (ref 7–18)
Bilirubin,Total: 0.6 mg/dL (ref 0.2–1.0)
Calcium, Total: 9 mg/dL (ref 8.5–10.1)
Co2: 31 mmol/L (ref 21–32)
Creatinine: 1.41 mg/dL — ABNORMAL HIGH (ref 0.60–1.30)
EGFR (African American): 46 — ABNORMAL LOW
Glucose: 211 mg/dL — ABNORMAL HIGH (ref 65–99)
Osmolality: 287 (ref 275–301)
Potassium: 4.3 mmol/L (ref 3.5–5.1)
SGPT (ALT): 9 U/L — ABNORMAL LOW (ref 12–78)

## 2012-05-24 LAB — PROTIME-INR: INR: 1.1

## 2012-05-24 LAB — CBC
HCT: 41.5 % (ref 35.0–47.0)
MCV: 69 fL — ABNORMAL LOW (ref 80–100)
RBC: 6 10*6/uL — ABNORMAL HIGH (ref 3.80–5.20)

## 2012-05-25 LAB — URINALYSIS, COMPLETE
Bilirubin,UR: NEGATIVE
Glucose,UR: 50 mg/dL (ref 0–75)
Ketone: NEGATIVE
Nitrite: NEGATIVE
Protein: NEGATIVE
RBC,UR: <1 /HPF (ref 0–5)
Specific Gravity: 1.018 (ref 1.003–1.030)
Squamous Epithelial: 1

## 2012-05-25 LAB — CBC WITH DIFFERENTIAL/PLATELET
Basophil #: 0.1 10*3/uL (ref 0.0–0.1)
HGB: 11.3 g/dL — ABNORMAL LOW (ref 12.0–16.0)
Lymphocyte #: 2 10*3/uL (ref 1.0–3.6)
MCV: 70 fL — ABNORMAL LOW (ref 80–100)
Monocyte #: 1.4 x10 3/mm — ABNORMAL HIGH (ref 0.2–0.9)
Monocyte %: 10.6 %
Neutrophil #: 9.3 10*3/uL — ABNORMAL HIGH (ref 1.4–6.5)
RDW: 15.2 % — ABNORMAL HIGH (ref 11.5–14.5)
WBC: 12.8 10*3/uL — ABNORMAL HIGH (ref 3.6–11.0)

## 2012-05-25 LAB — PROTIME-INR: INR: 1.1

## 2012-05-26 LAB — CBC WITH DIFFERENTIAL/PLATELET
Basophil #: 0.1 10*3/uL (ref 0.0–0.1)
Eosinophil %: 0.5 %
HCT: 32.6 % — ABNORMAL LOW (ref 35.0–47.0)
HGB: 10.2 g/dL — ABNORMAL LOW (ref 12.0–16.0)
Lymphocyte %: 10.4 %
MCH: 21.9 pg — ABNORMAL LOW (ref 26.0–34.0)
MCV: 70 fL — ABNORMAL LOW (ref 80–100)
Monocyte #: 1.8 x10 3/mm — ABNORMAL HIGH (ref 0.2–0.9)
Neutrophil #: 12.8 10*3/uL — ABNORMAL HIGH (ref 1.4–6.5)
Neutrophil %: 77.7 %
Platelet: 151 10*3/uL (ref 150–440)
RDW: 15.4 % — ABNORMAL HIGH (ref 11.5–14.5)

## 2012-05-26 LAB — BASIC METABOLIC PANEL
Anion Gap: 5 — ABNORMAL LOW (ref 7–16)
Calcium, Total: 8.4 mg/dL — ABNORMAL LOW (ref 8.5–10.1)
Chloride: 104 mmol/L (ref 98–107)
Co2: 27 mmol/L (ref 21–32)
EGFR (African American): 43 — ABNORMAL LOW
EGFR (Non-African Amer.): 37 — ABNORMAL LOW
Glucose: 243 mg/dL — ABNORMAL HIGH (ref 65–99)
Osmolality: 285 (ref 275–301)
Potassium: 4.4 mmol/L (ref 3.5–5.1)
Sodium: 136 mmol/L (ref 136–145)

## 2012-05-27 LAB — CBC WITH DIFFERENTIAL/PLATELET
Basophil #: 0.1 10*3/uL (ref 0.0–0.1)
Eosinophil #: 0.1 10*3/uL (ref 0.0–0.7)
Eosinophil %: 0.3 %
HCT: 28.8 % — ABNORMAL LOW (ref 35.0–47.0)
HGB: 8.7 g/dL — ABNORMAL LOW (ref 12.0–16.0)
Lymphocyte #: 1.8 10*3/uL (ref 1.0–3.6)
MCV: 70 fL — ABNORMAL LOW (ref 80–100)
Monocyte #: 2 x10 3/mm — ABNORMAL HIGH (ref 0.2–0.9)
Neutrophil %: 79.2 %
Platelet: 147 10*3/uL — ABNORMAL LOW (ref 150–440)
RDW: 15.1 % — ABNORMAL HIGH (ref 11.5–14.5)

## 2012-05-27 LAB — BASIC METABOLIC PANEL
BUN: 30 mg/dL — ABNORMAL HIGH (ref 7–18)
EGFR (African American): 32 — ABNORMAL LOW
Osmolality: 276 (ref 275–301)

## 2012-05-29 LAB — BASIC METABOLIC PANEL
Anion Gap: 8 (ref 7–16)
Chloride: 105 mmol/L (ref 98–107)
Co2: 22 mmol/L (ref 21–32)
Creatinine: 1.38 mg/dL — ABNORMAL HIGH (ref 0.60–1.30)
EGFR (African American): 48 — ABNORMAL LOW
EGFR (Non-African Amer.): 41 — ABNORMAL LOW
Glucose: 107 mg/dL — ABNORMAL HIGH (ref 65–99)
Osmolality: 280 (ref 275–301)
Potassium: 4.6 mmol/L (ref 3.5–5.1)
Sodium: 135 mmol/L — ABNORMAL LOW (ref 136–145)

## 2012-05-29 LAB — DIGOXIN LEVEL: Digoxin: 1.9 ng/mL

## 2012-05-30 LAB — BASIC METABOLIC PANEL
Anion Gap: 4 — ABNORMAL LOW (ref 7–16)
BUN: 32 mg/dL — ABNORMAL HIGH (ref 7–18)
Calcium, Total: 8.1 mg/dL — ABNORMAL LOW (ref 8.5–10.1)
Chloride: 110 mmol/L — ABNORMAL HIGH (ref 98–107)
Co2: 25 mmol/L (ref 21–32)
Creatinine: 1.04 mg/dL (ref 0.60–1.30)
EGFR (Non-African Amer.): 58 — ABNORMAL LOW
Potassium: 4 mmol/L (ref 3.5–5.1)

## 2012-05-31 LAB — CBC WITH DIFFERENTIAL/PLATELET
Basophil #: 0.1 10*3/uL (ref 0.0–0.1)
Basophil %: 0.7 %
Eosinophil #: 0 10*3/uL (ref 0.0–0.7)
Eosinophil %: 0.5 %
Eosinophil %: 0.4 %
HCT: 29.8 % — ABNORMAL LOW (ref 35.0–47.0)
HGB: 7.9 g/dL — ABNORMAL LOW (ref 12.0–16.0)
Lymphocyte #: 1.4 10*3/uL (ref 1.0–3.6)
Lymphocyte %: 14.1 %
MCH: 22.6 pg — ABNORMAL LOW (ref 26.0–34.0)
MCHC: 32.2 g/dL (ref 32.0–36.0)
MCV: 70 fL — ABNORMAL LOW (ref 80–100)
MCV: 72 fL — ABNORMAL LOW (ref 80–100)
Monocyte #: 1.2 x10 3/mm — ABNORMAL HIGH (ref 0.2–0.9)
Monocyte %: 11.5 %
Platelet: 244 10*3/uL (ref 150–440)
RBC: 3.49 10*6/uL — ABNORMAL LOW (ref 3.80–5.20)
RBC: 4.16 10*6/uL (ref 3.80–5.20)
RDW: 15.3 % — ABNORMAL HIGH (ref 11.5–14.5)
WBC: 10.2 10*3/uL (ref 3.6–11.0)
WBC: 10.2 10*3/uL (ref 3.6–11.0)

## 2012-10-30 ENCOUNTER — Telehealth: Payer: Self-pay | Admitting: Physician Assistant

## 2012-10-30 NOTE — Telephone Encounter (Signed)
Physician from [facility] Dr. Gar Gibbon contacted office with request for old ECG. Pt is on vacation up [place] and was admitted with WCT, ?VT vs AF. Requested old ECGs. Pt has expressed consent to medical records being shared. Old ECGs faxed to [phone]. Dunia Pringle PA-C

## 2012-11-22 ENCOUNTER — Encounter: Payer: Self-pay | Admitting: *Deleted

## 2012-11-28 ENCOUNTER — Encounter: Payer: Self-pay | Admitting: *Deleted

## 2012-11-29 ENCOUNTER — Encounter: Payer: Self-pay | Admitting: Cardiovascular Disease

## 2012-11-29 ENCOUNTER — Encounter: Payer: Self-pay | Admitting: *Deleted

## 2012-11-29 ENCOUNTER — Ambulatory Visit (INDEPENDENT_AMBULATORY_CARE_PROVIDER_SITE_OTHER): Payer: Medicare Other | Admitting: Cardiovascular Disease

## 2012-11-29 VITALS — BP 114/76 | HR 72 | Ht 63.0 in | Wt 187.2 lb

## 2012-11-29 DIAGNOSIS — I482 Chronic atrial fibrillation, unspecified: Secondary | ICD-10-CM | POA: Insufficient documentation

## 2012-11-29 DIAGNOSIS — I25810 Atherosclerosis of coronary artery bypass graft(s) without angina pectoris: Secondary | ICD-10-CM

## 2012-11-29 DIAGNOSIS — I509 Heart failure, unspecified: Secondary | ICD-10-CM

## 2012-11-29 DIAGNOSIS — I5032 Chronic diastolic (congestive) heart failure: Secondary | ICD-10-CM | POA: Insufficient documentation

## 2012-11-29 DIAGNOSIS — I10 Essential (primary) hypertension: Secondary | ICD-10-CM

## 2012-11-29 DIAGNOSIS — I4891 Unspecified atrial fibrillation: Secondary | ICD-10-CM

## 2012-11-29 NOTE — Progress Notes (Signed)
HPI  This is a 62 year old Caucasian female who is here today to establish cardiovascular care. I followed her in the past while at [facility]. She has a complicated medical history. She suffers from Charcot arthropathy with limited mobility. She has known history of chronic atrial fibrillation with previous massive GI bleed on warfarin. Previous atrial thrombus noted on TEE. She has previous morbid obesity but lost weight, chronic diastolic heart failure, hypertension, mild chronic kidney disease, hyperlipidemia and type 2 diabetes. She was recently visiting in [place] and had a witnessed seizure with loss of consciousness. This was preceded by a headache. CT scan of the head showed no significant abnormalities. The patient was unresponsive. She was noted to have wide complex tachycardia which was initially thought to be ventricular tachycardia. She had CPR and was shocked with 200 J. It was determined later that most likely she had A. fib with rapid ventricular response with known underlying left bundle branch block. Echocardiogram done during hospitalization showed an ejection fraction of 55%. Troponin peaked at 0.5 which was thought to be due to CPR. The patient was extubated. She was seen by cardiology and neurology. MRI of the brain was unremarkable. The patient was started on Keppra. Since hospital discharge, she has been doing reasonably well. She denies any chest pain, shortness of breath or palpitations. No further seizures. She is going to followup with neurology locally. She is thinking of moving back to [place] next year.  Allergies  Allergen Reactions  . Coumadin [Warfarin]   . Dilaudid [Hydromorphone Hcl]   . Tetracyclines & Related      Current Outpatient Prescriptions on File Prior to Visit  Medication Sig Dispense Refill  . cyclobenzaprine (FLEXERIL) 10 MG tablet Take 10 mg by mouth 2 (two) times daily as needed for muscle spasms.      . digoxin (LANOXIN) 0.125 MG  tablet Take 0.125 mg by mouth daily.      . furosemide (LASIX) 40 MG tablet Take 40 mg by mouth 2 (two) times daily.      Marland Kitchen nystatin cream (MYCOSTATIN) Apply topically as needed for dry skin.       No current facility-administered medications on file prior to visit.     Past Medical History  Diagnosis Date  . Charcot-Marie disease   . Hx MRSA infection   . Claustrophobia   . GERD (gastroesophageal reflux disease)   . Depression   . H/O blood clots   . Hypercholesterolemia   . HTN (hypertension)   . Diabetes   . Anxiety   . Bursitis, trochanteric   . Chronic kidney disease   . Syncope   . Obesity, morbid   . Sleep apnea   . Coronary artery disease   . Seizure   . Hypothyroidism   . Migraines   . Chronic atrial fibrillation     Previous massive life-threatening GI bleed while on warfarin. No previous cardioversion. Previous atrial thrombus noted on TEE in the past. Previous cardiac care was at [facility] clinic.  Marland Kitchen Left bundle branch block   . Chronic diastolic heart failure due to valvular disease      Past Surgical History  Procedure Laterality Date  . Total abdominal hysterectomy    . Toe amputation    . Tonsillectomy and adenoidectomy    . Femur surgery Right      Family History  Problem Relation Age of Onset  . Cancer Mother   . Heart attack Father  History   Social History  . Marital Status: Married    Spouse Name: N/A    Number of Children: N/A  . Years of Education: N/A   Occupational History  . Not on file.   Social History Main Topics  . Smoking status: Never Smoker   . Smokeless tobacco: Not on file  . Alcohol Use: No  . Drug Use: No  . Sexual Activity: Not on file   Other Topics Concern  . Not on file   Social History Narrative  . No narrative on file     ROS A 10 point review of system was performed. It's negative other than what is mentioned in the history of present illness.  PHYSICAL EXAM   BP 114/76  Pulse 72  Ht  5\' 3"  (1.6 m)  Wt 187 lb 3 oz (84.908 kg)  BMI 33.17 kg/m2 Constitutional: She is oriented to person, place, and time. She appears well-developed and well-nourished. No distress.  HENT: No nasal discharge.  Head: Normocephalic and atraumatic.  Eyes: Pupils are equal and round. Right eye exhibits no discharge. Left eye exhibits no discharge.  Neck: Normal range of motion. Neck supple. No JVD present. No thyromegaly present.  Cardiovascular: Normal rate, irregular rhythm, normal heart sounds. Exam reveals no gallop and no friction rub. No murmur heard.  Pulmonary/Chest: Effort normal and breath sounds normal. No stridor. No respiratory distress. She has no wheezes. She has no rales. She exhibits no tenderness.  Abdominal: Soft. Bowel sounds are normal. She exhibits no distension. There is no tenderness. There is no rebound and no guarding.  Musculoskeletal: Normal range of motion. She exhibits no edema and no tenderness.  Neurological: She is alert and oriented to person, place, and time. Coordination normal.  Skin: Skin is warm and dry. No rash noted. She is not diaphoretic. No erythema. No pallor.  Psychiatric: She has a normal mood and affect. Her behavior is normal. Judgment and thought content normal.      ASSESSMENT AND PLAN

## 2012-11-29 NOTE — Assessment & Plan Note (Signed)
Recent echo showed normal LV systolic function. She appears to be euvolemic. She is currently on Lasix 40 mg twice daily and spironolactone 25 mg once daily. Blood pressure is optimal.

## 2012-11-29 NOTE — Patient Instructions (Addendum)
Continue same medications.  Follow up in 3 months.  

## 2012-11-29 NOTE — Assessment & Plan Note (Signed)
Continue rate control with digoxin and metoprolol. Recent digoxin level was 1.3. Ejection fraction was normal by recent echo. I might consider decreasing the dose of digoxin in the near future. She had previous massive GI bleed while on warfarin. She has not been rechallenged with anticoagulation since that time. We might need to be addressed this in the future to balance the risk of stroke versus risk of bleeding. [place] VASc score is 4 and thus is at high risk for thromboembolic complications.

## 2013-03-06 ENCOUNTER — Ambulatory Visit: Payer: Self-pay | Admitting: Internal Medicine

## 2013-03-06 ENCOUNTER — Ambulatory Visit: Payer: Medicare Other | Admitting: Cardiovascular Disease

## 2013-03-23 ENCOUNTER — Ambulatory Visit: Payer: Self-pay | Admitting: Internal Medicine

## 2014-01-10 ENCOUNTER — Inpatient Hospital Stay: Payer: Self-pay | Admitting: Internal Medicine

## 2014-01-10 LAB — CBC WITH DIFFERENTIAL/PLATELET
BASOS PCT: 0.4 %
Basophil #: 0.1 10*3/uL (ref 0.0–0.1)
EOS ABS: 0.1 10*3/uL (ref 0.0–0.7)
EOS PCT: 0.6 %
HCT: 38.6 % (ref 35.0–47.0)
HGB: 11.8 g/dL — ABNORMAL LOW (ref 12.0–16.0)
LYMPHS ABS: 1.7 10*3/uL (ref 1.0–3.6)
LYMPHS PCT: 11 %
MCH: 22.4 pg — ABNORMAL LOW (ref 26.0–34.0)
MCHC: 30.5 g/dL — AB (ref 32.0–36.0)
MCV: 74 fL — AB (ref 80–100)
MONOS PCT: 6.5 %
Monocyte #: 1 x10 3/mm — ABNORMAL HIGH (ref 0.2–0.9)
NEUTROS ABS: 12.9 10*3/uL — AB (ref 1.4–6.5)
NEUTROS PCT: 81.5 %
Platelet: 250 10*3/uL (ref 150–440)
RBC: 5.25 10*6/uL — ABNORMAL HIGH (ref 3.80–5.20)
RDW: 16.9 % — ABNORMAL HIGH (ref 11.5–14.5)
WBC: 15.8 10*3/uL — AB (ref 3.6–11.0)

## 2014-01-10 LAB — BASIC METABOLIC PANEL
Anion Gap: 11 (ref 7–16)
BUN: 86 mg/dL — ABNORMAL HIGH (ref 7–18)
CHLORIDE: 102 mmol/L (ref 98–107)
CREATININE: 2.5 mg/dL — AB (ref 0.60–1.30)
Calcium, Total: 8.8 mg/dL (ref 8.5–10.1)
Co2: 26 mmol/L (ref 21–32)
EGFR (Non-African Amer.): 21 — ABNORMAL LOW
GFR CALC AF AMER: 25 — AB
GLUCOSE: 102 mg/dL — AB (ref 65–99)
OSMOLALITY: 304 (ref 275–301)
POTASSIUM: 4.4 mmol/L (ref 3.5–5.1)
Sodium: 139 mmol/L (ref 136–145)

## 2014-01-11 LAB — BASIC METABOLIC PANEL
ANION GAP: 7 (ref 7–16)
BUN: 65 mg/dL — AB (ref 7–18)
CO2: 27 mmol/L (ref 21–32)
Calcium, Total: 8.2 mg/dL — ABNORMAL LOW (ref 8.5–10.1)
Chloride: 104 mmol/L (ref 98–107)
Creatinine: 2.08 mg/dL — ABNORMAL HIGH (ref 0.60–1.30)
EGFR (African American): 31 — ABNORMAL LOW
EGFR (Non-African Amer.): 26 — ABNORMAL LOW
Glucose: 181 mg/dL — ABNORMAL HIGH (ref 65–99)
OSMOLALITY: 299 (ref 275–301)
Potassium: 4.3 mmol/L (ref 3.5–5.1)
SODIUM: 138 mmol/L (ref 136–145)

## 2014-01-11 LAB — CBC WITH DIFFERENTIAL/PLATELET
Basophil #: 0.1 10*3/uL (ref 0.0–0.1)
Basophil %: 0.7 %
Eosinophil #: 0.1 10*3/uL (ref 0.0–0.7)
Eosinophil %: 0.8 %
HCT: 35.7 % (ref 35.0–47.0)
HGB: 10.5 g/dL — ABNORMAL LOW (ref 12.0–16.0)
Lymphocyte #: 1.9 10*3/uL (ref 1.0–3.6)
Lymphocyte %: 16 %
MCH: 22.1 pg — ABNORMAL LOW (ref 26.0–34.0)
MCHC: 29.3 g/dL — ABNORMAL LOW (ref 32.0–36.0)
MCV: 75 fL — AB (ref 80–100)
Monocyte #: 1 x10 3/mm — ABNORMAL HIGH (ref 0.2–0.9)
Monocyte %: 8.8 %
Neutrophil #: 8.6 10*3/uL — ABNORMAL HIGH (ref 1.4–6.5)
Neutrophil %: 73.7 %
Platelet: 211 10*3/uL (ref 150–440)
RBC: 4.74 10*6/uL (ref 3.80–5.20)
RDW: 16.7 % — AB (ref 11.5–14.5)
WBC: 11.7 10*3/uL — AB (ref 3.6–11.0)

## 2014-01-12 LAB — BASIC METABOLIC PANEL
Anion Gap: 9 (ref 7–16)
BUN: 58 mg/dL — AB (ref 7–18)
CALCIUM: 8.6 mg/dL (ref 8.5–10.1)
CHLORIDE: 109 mmol/L — AB (ref 98–107)
Co2: 21 mmol/L (ref 21–32)
Creatinine: 1.94 mg/dL — ABNORMAL HIGH (ref 0.60–1.30)
GFR CALC AF AMER: 34 — AB
GFR CALC NON AF AMER: 28 — AB
Glucose: 162 mg/dL — ABNORMAL HIGH (ref 65–99)
Osmolality: 297 (ref 275–301)
Potassium: 4.9 mmol/L (ref 3.5–5.1)
Sodium: 139 mmol/L (ref 136–145)

## 2014-01-12 LAB — CBC WITH DIFFERENTIAL/PLATELET
BASOS PCT: 0.4 %
Basophil #: 0.1 10*3/uL (ref 0.0–0.1)
EOS ABS: 0.1 10*3/uL (ref 0.0–0.7)
Eosinophil %: 0.4 %
HCT: 36.2 % (ref 35.0–47.0)
HGB: 10.6 g/dL — AB (ref 12.0–16.0)
LYMPHS ABS: 1.5 10*3/uL (ref 1.0–3.6)
Lymphocyte %: 11.5 %
MCH: 22.3 pg — ABNORMAL LOW (ref 26.0–34.0)
MCHC: 29.2 g/dL — ABNORMAL LOW (ref 32.0–36.0)
MCV: 76 fL — ABNORMAL LOW (ref 80–100)
Monocyte #: 1 x10 3/mm — ABNORMAL HIGH (ref 0.2–0.9)
Monocyte %: 7.2 %
Neutrophil #: 10.9 10*3/uL — ABNORMAL HIGH (ref 1.4–6.5)
Neutrophil %: 80.5 %
Platelet: 193 10*3/uL (ref 150–440)
RBC: 4.76 10*6/uL (ref 3.80–5.20)
RDW: 17.2 % — AB (ref 11.5–14.5)
WBC: 13.5 10*3/uL — ABNORMAL HIGH (ref 3.6–11.0)

## 2014-01-13 LAB — CBC WITH DIFFERENTIAL/PLATELET
BASOS PCT: 0.7 %
Basophil #: 0.1 10*3/uL (ref 0.0–0.1)
EOS PCT: 0.4 %
Eosinophil #: 0.1 10*3/uL (ref 0.0–0.7)
HCT: 31.9 % — ABNORMAL LOW (ref 35.0–47.0)
HGB: 9.6 g/dL — ABNORMAL LOW (ref 12.0–16.0)
Lymphocyte #: 1.3 10*3/uL (ref 1.0–3.6)
Lymphocyte %: 10.4 %
MCH: 22.5 pg — AB (ref 26.0–34.0)
MCHC: 30 g/dL — ABNORMAL LOW (ref 32.0–36.0)
MCV: 75 fL — ABNORMAL LOW (ref 80–100)
MONO ABS: 1.1 x10 3/mm — AB (ref 0.2–0.9)
MONOS PCT: 8.5 %
NEUTROS ABS: 10.1 10*3/uL — AB (ref 1.4–6.5)
NEUTROS PCT: 80 %
Platelet: 174 10*3/uL (ref 150–440)
RBC: 4.25 10*6/uL (ref 3.80–5.20)
RDW: 16.8 % — ABNORMAL HIGH (ref 11.5–14.5)
WBC: 12.6 10*3/uL — ABNORMAL HIGH (ref 3.6–11.0)

## 2014-01-13 LAB — BASIC METABOLIC PANEL
ANION GAP: 9 (ref 7–16)
BUN: 44 mg/dL — AB (ref 7–18)
CALCIUM: 8.2 mg/dL — AB (ref 8.5–10.1)
CHLORIDE: 109 mmol/L — AB (ref 98–107)
CREATININE: 1.53 mg/dL — AB (ref 0.60–1.30)
Co2: 22 mmol/L (ref 21–32)
EGFR (African American): 44 — ABNORMAL LOW
GFR CALC NON AF AMER: 36 — AB
GLUCOSE: 213 mg/dL — AB (ref 65–99)
OSMOLALITY: 297 (ref 275–301)
POTASSIUM: 4.6 mmol/L (ref 3.5–5.1)
Sodium: 140 mmol/L (ref 136–145)

## 2014-06-19 ENCOUNTER — Inpatient Hospital Stay
Admit: 2014-06-19 | Disposition: A | Discharge status: Home / Self Care | Payer: Self-pay | Attending: Internal Medicine | Admitting: Internal Medicine

## 2014-06-19 LAB — TROPONIN I
TROPONIN-I: 0.04 ng/mL — AB
Troponin-I: 0.03 ng/mL

## 2014-06-19 LAB — COMPREHENSIVE METABOLIC PANEL
ALK PHOS: 156 U/L — AB
ALT: 8 U/L — AB
Albumin: 3.4 g/dL — ABNORMAL LOW
Anion Gap: 11 (ref 7–16)
BUN: 105 mg/dL — ABNORMAL HIGH
Bilirubin,Total: 1.4 mg/dL — ABNORMAL HIGH
CHLORIDE: 103 mmol/L
CO2: 20 mmol/L — AB
CREATININE: 3.61 mg/dL — AB
Calcium, Total: 8.7 mg/dL — ABNORMAL LOW
EGFR (African American): 15 — ABNORMAL LOW
EGFR (Non-African Amer.): 13 — ABNORMAL LOW
Glucose: 190 mg/dL — ABNORMAL HIGH
Potassium: 5 mmol/L
SGOT(AST): 15 U/L
Sodium: 134 mmol/L — ABNORMAL LOW
TOTAL PROTEIN: 7.2 g/dL

## 2014-06-19 LAB — CBC
HCT: 33.1 % — AB (ref 35.0–47.0)
HGB: 10.3 g/dL — AB (ref 12.0–16.0)
MCH: 22.5 pg — ABNORMAL LOW (ref 26.0–34.0)
MCHC: 31 g/dL — ABNORMAL LOW (ref 32.0–36.0)
MCV: 73 fL — ABNORMAL LOW (ref 80–100)
Platelet: 177 10*3/uL (ref 150–440)
RBC: 4.56 10*6/uL (ref 3.80–5.20)
RDW: 18.3 % — ABNORMAL HIGH (ref 11.5–14.5)
WBC: 25 10*3/uL — AB (ref 3.6–11.0)

## 2014-06-19 LAB — CK-MB
CK-MB: 5.1 ng/mL — ABNORMAL HIGH
CK-MB: 3.3 ng/mL

## 2014-06-19 LAB — PRO B NATRIURETIC PEPTIDE: B-Type Natriuretic Peptide: 432 pg/mL — ABNORMAL HIGH

## 2014-06-20 DIAGNOSIS — I340 Nonrheumatic mitral (valve) insufficiency: Secondary | ICD-10-CM | POA: Diagnosis not present

## 2014-06-20 LAB — BASIC METABOLIC PANEL
Anion Gap: 8 (ref 7–16)
BUN: 109 mg/dL — AB
CO2: 21 mmol/L — AB
Calcium, Total: 8.4 mg/dL — ABNORMAL LOW
Chloride: 104 mmol/L
Creatinine: 3.42 mg/dL — ABNORMAL HIGH
EGFR (African American): 16 — ABNORMAL LOW
EGFR (Non-African Amer.): 14 — ABNORMAL LOW
Glucose: 213 mg/dL — ABNORMAL HIGH
Potassium: 4.7 mmol/L
Sodium: 133 mmol/L — ABNORMAL LOW

## 2014-06-20 LAB — CBC WITH DIFFERENTIAL/PLATELET
BASOS ABS: 0 10*3/uL (ref 0.0–0.1)
BASOS PCT: 0.1 %
EOS ABS: 0 10*3/uL (ref 0.0–0.7)
Eosinophil %: 0 %
HCT: 31.3 % — ABNORMAL LOW (ref 35.0–47.0)
HGB: 9.5 g/dL — ABNORMAL LOW (ref 12.0–16.0)
LYMPHS PCT: 4.8 %
Lymphocyte #: 1 10*3/uL (ref 1.0–3.6)
MCH: 21.8 pg — AB (ref 26.0–34.0)
MCHC: 30.2 g/dL — ABNORMAL LOW (ref 32.0–36.0)
MCV: 72 fL — ABNORMAL LOW (ref 80–100)
MONO ABS: 1.3 x10 3/mm — AB (ref 0.2–0.9)
Monocyte %: 5.9 %
NEUTROS PCT: 89.2 %
Neutrophil #: 19.2 10*3/uL — ABNORMAL HIGH (ref 1.4–6.5)
PLATELETS: 185 10*3/uL (ref 150–440)
RBC: 4.35 10*6/uL (ref 3.80–5.20)
RDW: 17.8 % — AB (ref 11.5–14.5)
WBC: 21.5 10*3/uL — AB (ref 3.6–11.0)

## 2014-06-20 LAB — PROTEIN / CREATININE RATIO, URINE
Creatinine, Urine Random: 85 mg/dL (ref 30–125)
PROTEIN, URINE: 36 mg/dL (ref 0–9)
Protein/Creat. Ratio: 424 mg/gCREAT — ABNORMAL HIGH (ref 0–200)

## 2014-06-20 LAB — HEMOGLOBIN A1C: Hemoglobin A1C: 6.6 % — ABNORMAL HIGH

## 2014-06-20 LAB — PHOSPHORUS: PHOSPHORUS: 4.5 mg/dL

## 2014-06-20 LAB — URINALYSIS, COMPLETE
BLOOD: NEGATIVE
Bilirubin,UR: NEGATIVE
GLUCOSE, UR: NEGATIVE mg/dL (ref 0–75)
Ketone: NEGATIVE
LEUKOCYTE ESTERASE: NEGATIVE
Nitrite: NEGATIVE
Ph: 5 (ref 4.5–8.0)
Protein: NEGATIVE
Specific Gravity: 1.014 (ref 1.003–1.030)
Squamous Epithelial: 3
WBC UR: <1 /HPF (ref 0–5)

## 2014-06-20 LAB — CK-MB: CK-MB: 2.6 ng/mL

## 2014-06-20 LAB — TROPONIN I: Troponin-I: 0.03 ng/mL

## 2014-06-21 LAB — URINALYSIS, COMPLETE
Bilirubin,UR: NEGATIVE
Blood: NEGATIVE
GLUCOSE, UR: NEGATIVE mg/dL (ref 0–75)
KETONE: NEGATIVE
Leukocyte Esterase: NEGATIVE
Nitrite: NEGATIVE
Ph: 5 (ref 4.5–8.0)
Protein: NEGATIVE
RBC,UR: NONE SEEN /HPF (ref 0–5)
Specific Gravity: 1.013 (ref 1.003–1.030)
Squamous Epithelial: 2

## 2014-06-21 LAB — BASIC METABOLIC PANEL
ANION GAP: 5 — AB (ref 7–16)
BUN: 97 mg/dL — ABNORMAL HIGH
CHLORIDE: 110 mmol/L
Calcium, Total: 8.3 mg/dL — ABNORMAL LOW
Co2: 22 mmol/L
Creatinine: 2.62 mg/dL — ABNORMAL HIGH
EGFR (African American): 22 — ABNORMAL LOW
EGFR (Non-African Amer.): 19 — ABNORMAL LOW
GLUCOSE: 108 mg/dL — AB
Potassium: 4.3 mmol/L
Sodium: 137 mmol/L

## 2014-06-21 LAB — CBC WITH DIFFERENTIAL/PLATELET
Basophil #: 0 10*3/uL (ref 0.0–0.1)
Basophil %: 0.2 %
Eosinophil #: 0 10*3/uL (ref 0.0–0.7)
Eosinophil %: 0.3 %
HCT: 31.2 % — ABNORMAL LOW (ref 35.0–47.0)
HGB: 9.4 g/dL — ABNORMAL LOW (ref 12.0–16.0)
Lymphocyte #: 1.7 10*3/uL (ref 1.0–3.6)
Lymphocyte %: 9.9 %
MCH: 22.1 pg — ABNORMAL LOW (ref 26.0–34.0)
MCHC: 30.3 g/dL — ABNORMAL LOW (ref 32.0–36.0)
MCV: 73 fL — AB (ref 80–100)
MONOS PCT: 7.4 %
Monocyte #: 1.2 x10 3/mm — ABNORMAL HIGH (ref 0.2–0.9)
NEUTROS ABS: 13.8 10*3/uL — AB (ref 1.4–6.5)
Neutrophil %: 82.2 %
Platelet: 198 10*3/uL (ref 150–440)
RBC: 4.27 10*6/uL (ref 3.80–5.20)
RDW: 18.1 % — ABNORMAL HIGH (ref 11.5–14.5)
WBC: 16.7 10*3/uL — AB (ref 3.6–11.0)

## 2014-06-22 LAB — BASIC METABOLIC PANEL
Anion Gap: 7 (ref 7–16)
BUN: 84 mg/dL — ABNORMAL HIGH
CREATININE: 2.15 mg/dL — AB
Calcium, Total: 8.7 mg/dL — ABNORMAL LOW
Chloride: 110 mmol/L
Co2: 21 mmol/L — ABNORMAL LOW
EGFR (African American): 28 — ABNORMAL LOW
EGFR (Non-African Amer.): 24 — ABNORMAL LOW
Glucose: 194 mg/dL — ABNORMAL HIGH
Potassium: 4.9 mmol/L
SODIUM: 138 mmol/L

## 2014-06-22 LAB — CBC WITH DIFFERENTIAL/PLATELET
BASOS PCT: 0.4 %
Basophil #: 0.1 10*3/uL (ref 0.0–0.1)
EOS PCT: 0.4 %
Eosinophil #: 0.1 10*3/uL (ref 0.0–0.7)
HCT: 31.6 % — ABNORMAL LOW (ref 35.0–47.0)
HGB: 9.5 g/dL — ABNORMAL LOW (ref 12.0–16.0)
Lymphocyte #: 1.7 10*3/uL (ref 1.0–3.6)
Lymphocyte %: 9.9 %
MCH: 21.9 pg — AB (ref 26.0–34.0)
MCHC: 30.1 g/dL — ABNORMAL LOW (ref 32.0–36.0)
MCV: 73 fL — ABNORMAL LOW (ref 80–100)
MONOS PCT: 6.3 %
Monocyte #: 1.1 x10 3/mm — ABNORMAL HIGH (ref 0.2–0.9)
NEUTROS PCT: 83 %
Neutrophil #: 13.9 10*3/uL — ABNORMAL HIGH (ref 1.4–6.5)
Platelet: 192 10*3/uL (ref 150–440)
RBC: 4.36 10*6/uL (ref 3.80–5.20)
RDW: 18.1 % — ABNORMAL HIGH (ref 11.5–14.5)
WBC: 16.8 10*3/uL — AB (ref 3.6–11.0)

## 2014-06-22 LAB — PROTEIN ELECTROPHORESIS(ARMC)

## 2014-06-22 LAB — UR PROT ELECTROPHORESIS, URINE RANDOM

## 2014-06-23 LAB — BASIC METABOLIC PANEL
Anion Gap: 2 — ABNORMAL LOW (ref 7–16)
BUN: 69 mg/dL — ABNORMAL HIGH
CALCIUM: 8.4 mg/dL — AB
Chloride: 113 mmol/L — ABNORMAL HIGH
Co2: 23 mmol/L
Creatinine: 1.79 mg/dL — ABNORMAL HIGH
EGFR (African American): 34 — ABNORMAL LOW
GFR CALC NON AF AMER: 30 — AB
Glucose: 109 mg/dL — ABNORMAL HIGH
POTASSIUM: 4.7 mmol/L
SODIUM: 138 mmol/L

## 2014-06-23 LAB — WBC: WBC: 16.7 10*3/uL — ABNORMAL HIGH (ref 3.6–11.0)

## 2014-06-24 LAB — BASIC METABOLIC PANEL
ANION GAP: 4 — AB (ref 7–16)
BUN: 56 mg/dL — ABNORMAL HIGH
CALCIUM: 8.7 mg/dL — AB
CHLORIDE: 112 mmol/L — AB
CO2: 20 mmol/L — AB
Creatinine: 1.59 mg/dL — ABNORMAL HIGH
EGFR (African American): 40 — ABNORMAL LOW
GFR CALC NON AF AMER: 34 — AB
Glucose: 237 mg/dL — ABNORMAL HIGH
POTASSIUM: 4.6 mmol/L
SODIUM: 136 mmol/L

## 2014-06-24 LAB — CULTURE, BLOOD (SINGLE)

## 2014-07-13 NOTE — Consult Note (Signed)
  DATE OF BIRTH:  1951-02-23  AGE:  64  SEX:  Female  RACE:  White  DATE OF CONSULTATION:  05/29/2012  [facility]  [place]  ROOM #149  CONSULTING PHYSICIAN:  Surya K. Challa, MD  SUBJECTIVE:  The patient was asked to be seen in room #149. This is her third postoperative day, and she had surgery 3 days ago on her femur for a fracture. Staff reports that patient had been yelling and calling her husband and other family members, and is not aware of that. Initially she was asking for a gun.   OBJECTIVE:  The patient was seen lying in her room, calm and quiet. She is drowsy and sleepy. She said she was in a Hospital when she was asked, and then she was in a place and she could not name the place. She reports that she is depressed because she is very tired, very slow and monotonous in her tone. She reported that she did not have any previous history of inpatient psychiatry. She did not open her eyes when questions were being asked. Information was obtained from the staff. She does not appear to be actively hallucinating, and she does not appear to be pulling or making any gestures. Insight and judgment are guarded.  IMPRESSION:  Delirium, probably secondary to postoperative stage and recovery.  RECOMMENDATION:  Haldol 1 mg p.o. or IM q.6 hours p.r.n.  Ativan 1 mg p.o. or IM q.6 hours p.r.n. for agitation. After the patient clears up, she can be considered for an antidepressant medication like Celexa 20 mg to help her with her depression.   ____________________________ Jannet MantisSurya K. Guss Bundehalla, MD skc:mr D: 05/29/2012 18:14:00 ET T: 05/29/2012 18:31:49 ET JOB#: [id number]  cc: Monika SalkSurya K. Guss Bundehalla, MD, <Dictator> Beau FannySURYA K CHALLA MD ELECTRONICALLY SIGNED 06/04/2012 19:51

## 2014-07-13 NOTE — Consult Note (Signed)
Chief Complaint:  Subjective/Chief Complaint No new complaints s/p surgery.   VITAL SIGNS/ANCILLARY NOTES: **Vital Signs.:   07-Mar-14 13:58  Vital Signs Type Q 4hr  Temperature Temperature (F) 98  Celsius 36.6  Temperature Source oral  Pulse Pulse 74  Respirations Respirations 18  Systolic BP Systolic BP 924  Diastolic BP (mmHg) Diastolic BP (mmHg) 70  Mean BP 85  Pulse Ox % Pulse Ox % 97  Pulse Ox Activity Level  At rest  Oxygen Delivery 3L   Brief Assessment:  Cardiac Irregular  no murmur   Respiratory clear BS   Gastrointestinal Normal   Gastrointestinal details normal Soft  Nontender   Additional Physical Exam right leg in soft splint   Lab Results: Routine Chem:  07-Mar-14 06:44   Glucose, Serum  185  BUN  30  Creatinine (comp)  1.93  Sodium, Serum  132  Potassium, Serum 5.0  Chloride, Serum 101  CO2, Serum 23  Calcium (Total), Serum  8.2  Anion Gap 8  Osmolality (calc) 276  eGFR (African American)  32  eGFR (Non-African American)  27 (eGFR values <25m/min/1.73 m2 may be an indication of chronic kidney disease (CKD). Calculated eGFR is useful in patients with stable renal function. The eGFR calculation will not be reliable in acutely ill patients when serum creatinine is changing rapidly. It is not useful in  patients on dialysis. The eGFR calculation may not be applicable to patients at the low and high extremes of body sizes, pregnant women, and vegetarians.)  Routine Hem:  07-Mar-14 06:44   WBC (CBC)  18.7  RBC (CBC) 4.12  Hemoglobin (CBC)  8.7  Hematocrit (CBC)  28.8  Platelet Count (CBC)  147  MCV  70  MCH  21.1  MCHC  30.1  RDW  15.1  Neutrophil % 79.2  Lymphocyte % 9.4  Monocyte % 10.8  Eosinophil % 0.3  Basophil % 0.3  Neutrophil #  14.8  Lymphocyte # 1.8  Monocyte #  2.0  Eosinophil # 0.1  Basophil # 0.1 (Result(s) reported on 27 May 2012 at 07:43AM.)   Assessment/Plan:  Invasive Device Daily Assessment of Necessity:  Does  the patient currently have any of the following indwelling devices? foley   Indwelling Urinary Catheter continued, requirement due to   Reason to continue Indwelling Urinary Catheter preop or postop order according to surgical protocols   Assessment/Plan:  Assessment R femur fracture DM2 Afib HTN Charcot's arthropathy Leucocytosis Acute on chronic kidney disease   Plan Ct SSRI, analgesia. Dc lasix Order cxr, ct cefazolin   Electronic Signatures: TVeverly Fells(MD)  (Signed 07-Mar-14 16:40)  Authored: Chief Complaint, VITAL SIGNS/ANCILLARY NOTES, Brief Assessment, Lab Results, Assessment/Plan   Last Updated: 07-Mar-14 16:40 by TVeverly Fells(MD)

## 2014-07-13 NOTE — H&P (Signed)
Subjective/Chief Complaint Right knee pain   History of Present Illness [name] Kent while attempting to get into truck.  Landed on right knee. Significant pain.  Patient has had pain in the right hip and knee predating this injury since 2011 after being in a medically induced coma after a seizure.  She states that the pain was not evaluated but has been ongoing.   Past Med/Surgical Hx:  Charcot disease:   Multi-drug Resistant Organism (MDRO): Positive culture for MRSA.  claustrophobia:   GERD - Esophageal Reflux:   CHF:   Depression:   Blood Clots:   Hypercholesterolemia:   a-fib:   HTN:   Diabetes:   Hysterectomy - Total:   right toe removed:   ALLERGIES:  Dilaudid: Resp. Distress  Tetracycline: Hives  HOME MEDICATIONS: Medication Instructions Status  metoprolol 75mg  twice a day  Active  nystatin cream  Active  digoxin tablet 125 mcg (0.125 mg) 1 tab(s) orally once a day  Active  cyclobenzaprine tablet 10 mg 1 tab(s) orally 2 times a day  Active  Perforomist solution 20 mcg/2 mL 2 mL inhaled once a day  Active  omeprazole delayed release capsule 40 mg 1 cap(s) orally once a day  Active  Depakote enteric coated tablet 250 mg 1 tab(s) orally 2 times a day  Active  Crestor tablet 5 mg 1 tab(s) orally once a day (at bedtime)  Active  aspirin tablet 81 mg 1 tab(s) orally once a day  Active  methylphenidate 10 mg oral tablet 1  orally 2 times a day  Active  clonazepam 0.5 mg oral tablet 1  orally 2 times a day  Active  Cymbalta 90mg   1   once a day  Active  furosemide 40 mg oral tablet 1  orally 2 times a day  Active  Proventil 2.5 mg/3 mL (0.083%) inhalation solution 1  inhaled   Active  Levothroid 125 mcg (0.125 mg) oral tablet 1  orally once a day  Active  Lopressor 50 mg oral tablet 1  orally 2 times a day  Active  Klor-Con M20 oral tablet, extended release 1  orally once a day  Active  Onglyza 5 mg oral tablet 0.5  orally once a day  Active   Family and Social History:   Family History Non-Contributory   Place of Living Home  with husband.  Ambulates in home with walker or cane.  Out of home uses wheelchair.   Review of Systems:  Subjective/Chief Complaint Right knee pain   Fever/Chills No   Cough No   Sputum No   Abdominal Pain No   Diarrhea No   Constipation No   Nausea/Vomiting No   SOB/DOE No   Chest Pain No   Telemetry Reviewed Afib   Dysuria No   Physical Exam:  GEN well developed, well nourished, distressed   HEENT pink conjunctivae, PERRL, moist oral mucosa   NECK supple  No masses   RESP normal resp effort  no use of accessory muscles   CARD irregular rate   ABD denies tenderness  denies Flank Tenderness  hyperactive BS   GU foley catheter pending   LYMPH negative neck   EXTR Right foot with loss of 1st two toes due to injury followed by infection.  Sensation SPN and Tib n intact.  DPN sensation diminished.  Foot warm.  Knee held in flexion.  Significant swelling.   SKIN normal to palpation, No rashes, No ulcers   NEURO see extremity exam  PSYCH A+O to time, place, person, good insight, anxious   Radiology Results: XRay:    04-Mar-14 15:52, Knee Right Complete  Knee Right Complete  REASON FOR EXAM:    pain 2nd to fall  COMMENTS:   May transport without cardiac monitor    PROCEDURE: DXR - DXR KNEE RT COMP WITH OBLIQUES  - May 24 2012  3:52PM     RESULT: Comparison:  None    Findings:    5 views of the right knee demonstrates an oblique comminuted fracture of   the distal femoral diaphysis with mild apex volar angulation. There is   lateral displacement of the distal fracture fragment. There is no   significant joint effusion.  There is severe degenerative changes of the   medial tibiofemoral compartment.  IMPRESSION:     Please see above.    Dictation Site: 1        Verified By: Joellyn Haff, M.D., MD  LabUnknown:  PACS Image  CT:    04-Mar-14 17:11, CT Knee Right Without Contrast  CT  Knee Right Without Contrast  REASON FOR EXAM:    fracture right knee  COMMENTS:       PROCEDURE: CT  - CT KNEE RIGHT WO  - May 24 2012  5:11PM     RESULT: Multislice helical acquisition through the right knee is   reconstructed at bone window settings in the axial, coronal and sagittal   planes. The patient has no previous exam for comparison.    There is a complex fracture in the distal shaft of the right femur with   comminution. The femoral condyles appear to be intact. There is slight   posterior displacement of the distal portion of the femur less than one   half shaft width. There is depression of a cortical portion along the   anterior aspect. Cortical fracture extends into the distal femoral shaft   and supracondylar region without definite articular extent. Thisis most     prominently seen about the anterolateral and anterior portions of the   femur.    IMPRESSION:  Complex comminuted fracture involving the distal femoral   shaft. Slight displacement.    Dictation Site: 1        Verified By: Elveria Royals, M.D., MD    Assessment/Admission Diagnosis Comminuted distal femure fracture right leg with significant osteopenia.   Plan ORIF of distal femur right side on Thursday morning. Medical clearance pending Bedrest NWB Knee immobilizer  NPO after midnight tomorrow for planned surgery Monday   Electronic Signatures: Murlean Hark (MD)  (Signed 04-Mar-14 17:41)  Authored: CHIEF COMPLAINT and HISTORY, PAST MEDICAL/SURGIAL HISTORY, ALLERGIES, HOME MEDICATIONS, FAMILY AND SOCIAL HISTORY, REVIEW OF SYSTEMS, PHYSICAL EXAM, Radiology, ASSESSMENT AND PLAN   Last Updated: 04-Mar-14 17:41 by Murlean Hark (MD)

## 2014-07-13 NOTE — Consult Note (Signed)
Chief Complaint:  Subjective/Chief Complaint No new complaints but a little confused today.  BP improved today.S/p surgery.   VITAL SIGNS/ANCILLARY NOTES: **Vital Signs.:   09-Mar-14 04:39  Vital Signs Type Q 4hr  Temperature Temperature (F) 98.3  Celsius 36.8  Temperature Source oral  Pulse Pulse 76  Respirations Respirations 22  Systolic BP Systolic BP 782  Diastolic BP (mmHg) Diastolic BP (mmHg) 81  Mean BP 93  Pulse Ox % Pulse Ox % 93  Pulse Ox Activity Level  At rest  Oxygen Delivery 2L  *Intake and Output.:   09-Mar-14 07:00  Grand Totals Intake:   Output:  850    Net:  -11 24 Hr.:  -1170  Urine ml     Out:  850  Urinary Method  Foley   Brief Assessment:  Cardiac Irregular  no murmur   Respiratory clear BS   Gastrointestinal Normal   Gastrointestinal details normal Soft  Nontender   Additional Physical Exam right leg in soft splint   Lab Results: TDMs:  09-Mar-14 05:00   Digoxin, Serum 1.90 (Therapeutic range for digoxin in patients with atrial fibrillation: 0.8 - 2.0 ng/mL. In patients with congestive heart failure a therapeutic range of 0.5 - 0.8 ng/mL is suggested as higher levels are associated with an increased risk of toxicity without clear evidence of enhanced efficacy. Digoxin toxicity is commonly associated with serum levels > 2.0 ng/mL but may occur with lower levels, including those in the therapeutic range. Blood samples should be obtained 6-8 hours after administration to assure a reasonable volume of distribution.)  Routine Chem:  09-Mar-14 05:00   Glucose, Serum  107  BUN  38  Creatinine (comp)  1.38  Sodium, Serum  135  Potassium, Serum 4.6  Chloride, Serum 105  CO2, Serum 22  Calcium (Total), Serum  8.0  Anion Gap 8  Osmolality (calc) 280  eGFR (African American)  48  eGFR (Non-African American)  41 (eGFR values <12m/min/1.73 m2 may be an indication of chronic kidney disease (CKD). Calculated eGFR is useful in patients  with stable renal function. The eGFR calculation will not be reliable in acutely ill patients when serum creatinine is changing rapidly. It is not useful in  patients on dialysis. The eGFR calculation may not be applicable to patients at the low and high extremes of body sizes, pregnant women, and vegetarians.)   Assessment/Plan:  Invasive Device Daily Assessment of Necessity:  Does the patient currently have any of the following indwelling devices? foley   Indwelling Urinary Catheter continued, requirement due to   Reason to continue Indwelling Urinary Catheter preop or postop order according to surgical protocols   Assessment/Plan:  Assessment R femur fracture DM2 Afib HTN Charcot's arthropathy Leucocytosis Acute on chronic kidney disease-cr back to baseline   Plan Ct SSRI, analgesia. Dc lasix reduce ivf to ns at 775m  Electronic Signatures: TeVeverly FellsMD)  (Signed 09-Mar-14 09:30)  Authored: Chief Complaint, VITAL SIGNS/ANCILLARY NOTES, Brief Assessment, Lab Results, Assessment/Plan   Last Updated: 09-Mar-14 09:30 by TeVeverly FellsMD)

## 2014-07-13 NOTE — Consult Note (Signed)
Chief Complaint:  Subjective/Chief Complaint No new complaints but drowsy today.  BP improved today.S/p surgery.   VITAL SIGNS/ANCILLARY NOTES: **Vital Signs.:   10-Mar-14 08:28  Temperature Temperature (F) 98.2  Celsius 36.7  Temperature Source oral  Pulse Pulse 77  Respirations Respirations 20  Systolic BP Systolic BP 245  Diastolic BP (mmHg) Diastolic BP (mmHg) 71  Mean BP 87  Pulse Ox % Pulse Ox % 99  Pulse Ox Activity Level  At rest  Oxygen Delivery 2L  *Intake and Output.:   Daily 10-Mar-14 07:00  Grand Totals Intake:  1102 Output:  2075    Net:  -973 24 Hr.:  -973  Oral Intake      In:  240  IV (Primary)      In:  862  Urine ml     Out:  2075  Length of Stay Totals Intake:  7865 Output:  8099    Net:  490   Brief Assessment:  Cardiac Irregular  no murmur   Respiratory clear BS   Gastrointestinal Normal   Gastrointestinal details normal Soft  Nontender   Additional Physical Exam right leg in soft splint   Lab Results: Routine Chem:  10-Mar-14 05:33   Glucose, Serum  64  BUN  32  Creatinine (comp) 1.04  Sodium, Serum 139  Potassium, Serum 4.0  Chloride, Serum  110  CO2, Serum 25  Calcium (Total), Serum  8.1  Anion Gap  4  Osmolality (calc) 283  eGFR (African American) >60  eGFR (Non-African American)  58 (eGFR values <95m/min/1.73 m2 may be an indication of chronic kidney disease (CKD). Calculated eGFR is useful in patients with stable renal function. The eGFR calculation will not be reliable in acutely ill patients when serum creatinine is changing rapidly. It is not useful in  patients on dialysis. The eGFR calculation may not be applicable to patients at the low and high extremes of body sizes, pregnant women, and vegetarians.)   Assessment/Plan:  Invasive Device Daily Assessment of Necessity:  Does the patient currently have any of the following indwelling devices? foley   Indwelling Urinary Catheter continued, requirement due to    Reason to continue Indwelling Urinary Catheter preop or postop order according to surgical protocols   Assessment/Plan:  Assessment R femur fracture DM2 Afib HTN Charcot's arthropathy Leucocytosis Acute on chronic kidney disease-cr back to baseline Delirium   Plan Ct SSRI, analgesia. Pscych input noted-will dc risperdal and change diazepam to prn  reduce ivf to ns kvo   Electronic Signatures: TVeverly Fells(MD)  (Signed 10-Mar-14 13:51)  Authored: Chief Complaint, VITAL SIGNS/ANCILLARY NOTES, Brief Assessment, Lab Results, Assessment/Plan   Last Updated: 10-Mar-14 13:51 by TVeverly Fells(MD)

## 2014-07-13 NOTE — Discharge Summary (Signed)
PATIENT NAME:  [name] Kent, [name] Kent MR#:  [id number] DATE OF BIRTH:  March 30, 1950  DATE OF ADMISSION:  05/24/2012 DATE OF DISCHARGE:  05/31/2012  ADMITTING DIAGNOSIS: Displaced right distal femoral shaft fracture.   DISCHARGE DIAGNOSIS: Displaced right distal femoral shaft fracture.   OPERATION: On 05/26/2012, she had an ORIF of the right distal femur.    Surgeon: Dr. Murlean Hark.   Anesthesia: Spinal.   EBL: 200 mL.   Implants:  Synthes LCP periarticular plate.   Complications:  None.   Drains: None.   HISTORY: The patient is a 64 year old female who sustained a fall on 05/24/2012 where she injured her right knee.  This happened while she was attempting to get into a truck. She presented to [facility] ED on the same day where x-rays confirmed a distal femoral shaft fracture. Orthopedics was consulted and admitted the patient.   PHYSICAL EXAMINATION:  GENERAL: Well-developed, well-nourished, distressed.  HEENT: Pink conjunctivae, PERRL. Moist oral mucosa.  NECK: Supple, no masses.  RESPIRATORY: Normal respiratory effort. No use of accessory muscles.  CARDIOVASCULAR: Irregular rate.  ABDOMEN: Denies tenderness. Denies flank tenderness. Hyperactive bowel sounds.  GENITOURINARY: Foley catheter pending.  LYMPHATIC: Negative neck.  EXTREMITIES: Right foot with loss of first 2 toes due to injury, followed by infection. Sensation SPN and tibial nerve intact. DPN and sensation diminished. Foot warm. Knee held in flexion. Significant swelling.  SKIN: Normal to palpation. No rashes or ulcers.  PSYCHIATRIC: Alert and oriented to time, place and person. Good insight but anxious.   HOSPITAL COURSE: After initial admission on 05/24/2012, the patient had Medicine consulted to clear her for surgery. She had surgery on 05/24/2012. She had good pain control afterwards and was brought to the orthopedic floor from the PACU. Her initial hemoglobin on postoperative day 1, 05/27/2012, was 8.7 and 28.8  hematocrit. BUN and creatinine were elevated, but this is baseline for her. Physical therapy was begun that day, but she did not tolerate. She was nonweightbearing on the right lower extremity with a postoperative hinged knee brace in place. On postoperative day 2, 05/28/2012, she had a hemoglobin of 8.6. She did not tolerate therapy well due to pain. Her Foley catheter was removed, but she had urinary retention, and a Foley catheter was placed on 05/29/2012. She has kept a Foley catheter in place since that time. On postoperative day 3, 05/29/2012, her BUN and creatinine continued to be elevated. Hemoglobin and Hematocrit were not drawn. She did not tolerate therapy due to pain and was also very lethargic. On postoperative day 4, 05/30/2012, she continued to be lethargic with physical therapy. Her BUN and creatinine were almost normal. She did not have a hematocrit or hemoglobin drawn. On 05/30/2012, also, Medicine adjusted her medicines, which included discontinuing the Risperdal to help with her lethargy.  On 05/31/2012, postoperative day 5, her hemoglobin was 7.9 and her hematocrit 24.5. Decision was made to transfuse 1 unit of packed red cells for acute blood loss anemia. The Foley catheter was removed on 05/31/2012, and bladder training was begun. She is stable and ready for discharge to rehab.   DISCHARGE MEDICATIONS: 1. Furosemide 40 mg 1 tab p.o. once daily as needed for swelling.  2. Onglyza 5 mg 1 tab p.o. once daily.  3. Digoxin 125 mcg, 1 tab p.o. once daily.  4. Levothyroxine 50 mcg 1 tab p.o. once daily.  5. Iron 150, vitamin B12 with C, folic acid and iron oral tablet 1 tab p.o. once daily.  6. Famotidine 20 mg  1 tab p.o. twice daily.  7. Nystatin topical 100,000 units per gram topical cream, apply topically to affected area as needed for rash.  8. Colcrys 0.6 mg tablet 1 tab p.o. once daily.  9. Diazepam 5 mg 1 tab p.o. twice daily.  10. Spironolactone 25 mg 1 tab p.o. once daily.   11. Metformin 500 mg 1 tab p.o. b.i.d.  12. Simvastatin 1 tab p.o. once daily.  13. Enalapril 2.5 mg 1 tab p.o. once daily.  14. Levemir 100 units/mL subcutaneous solution 40 units subcutaneous twice daily.  15. Tramadol 50 mg tablet 1 tab p.o. q. 6 hours p.r.n. pain.  16. Acetaminophen 325 mg 2 tablets p.o. q. 4 hours p.r.n. pain.  17. Acetaminophen/hydrocodone 325/5, 1 tab p.o. q. 4 to 6 hours p.r.n. pain.  18. Duloxetine 30 mg delayed release capsule 3 caps p.o. once daily.  19. Ondansetron 4 mg tablet 1 tab p.o. q. 8 hours p.r.n. nausea and vomiting.  20. Docusate 240 mg oral capsule 1 capsule p.o. at bedtime.  21. Multivitamin 1 tab p.o. once daily.  22. Cyclobenzaprine 10 mg 1 tab p.o. b.i.d. p.r.n. muscle spasm.  23. Enoxaparin 40 mg injectable solution subcutaneous 1 injection subcutaneous once daily x 2 weeks.   DISCHARGE INSTRUCTIONS:  1. The patient will [facility] 2 weeks for staple removal.  2. She will do physical therapy and should be nonweightbearing on the right lower extremity.  3. She should keep her hinged knee brace locked in extension on at all times.  4. She should have an ADA diet.  5. She will use TED hose knee-high bilaterally.  6. Dressing can be changed once daily on an as-needed basis.   ____________________________ April M. Haskel KhanBerndt, NP amb:cb D: 05/31/2012 12:20:58 ET T: 05/31/2012 12:43:38 ET JOB#: [id number]  cc: April M. Haskel KhanBerndt, NP, <Dictator> Burt EkAPRIL M BERNDT FNP ELECTRONICALLY SIGNED 06/23/2012 9:57

## 2014-07-13 NOTE — Consult Note (Signed)
Chief Complaint:  Subjective/Chief Complaint No complaints except acute pain.   VITAL SIGNS/ANCILLARY NOTES: **Vital Signs.:   05-Mar-14 13:10  Vital Signs Type Q 4hr  Temperature Temperature (F) 98.1  Celsius 36.7  Temperature Source oral  Pulse Pulse 87  Respirations Respirations 18  Systolic BP Systolic BP 123  Diastolic BP (mmHg) Diastolic BP (mmHg) 77  Mean BP 92  Pulse Ox % Pulse Ox % 96  Pulse Ox Activity Level  At rest  Oxygen Delivery Room Air/ 21 %   Brief Assessment:  Cardiac Irregular  no murmur   Respiratory clear BS   Gastrointestinal Normal   Gastrointestinal details normal Soft  Nontender   Additional Physical Exam right leg in soft splint with shortening of leg   Lab Results: Routine Coag:  05-Mar-14 05:06   Prothrombin 14.2  INR 1.1 (INR reference interval applies to patients on anticoagulant therapy. A single INR therapeutic range for coumarins is not optimal for all indications; however, the suggested range for most indications is 2.0 - 3.0. Exceptions to the INR Reference Range may include: Prosthetic heart valves, acute myocardial infarction, prevention of myocardial infarction, and combinations of aspirin and anticoagulant. The need for a higher or lower target INR must be assessed individually. Reference: The Pharmacology and Management of the Vitamin K  antagonists: the seventh ACCP Conference on Antithrombotic and Thrombolytic Therapy. Chest.2004 Sept:126 (3suppl): [id number]. A HCT value >55% may artifactually increase the PT.  In one study,  the increase was an average of 25%. Reference:  "Effect on Routine and Special Coagulation Testing Values of Citrate Anticoagulant Adjustment in Patients with High HCT Values." American Journal of Clinical Pathology 2006;126:400-405.)  Routine Hem:  05-Mar-14 05:06   WBC (CBC)  12.8  RBC (CBC) 5.07  Hemoglobin (CBC)  11.3  Hematocrit (CBC) 35.5  Platelet Count (CBC) 175  MCV  70  MCH  22.2   MCHC  31.8  RDW  15.2  Neutrophil % 72.8  Lymphocyte % 15.5  Monocyte % 10.6  Eosinophil % 0.7  Basophil % 0.4  Neutrophil #  9.3  Lymphocyte # 2.0  Monocyte #  1.4  Eosinophil # 0.1  Basophil # 0.1 (Result(s) reported on 25 May 2012 at 06:26AM.)   Assessment/Plan:  Assessment/Plan:  Assessment R femur fracture DM2 Afib HTN Charcot's arthropathy   Plan Ct SSRI, analgesia. Scheduled for surgery on 05/26/12   Electronic Signatures: Charlesetta Garibaldiejan Sie, Sheikh A (MD)  (Signed 05-Mar-14 13:37)  Authored: Chief Complaint, VITAL SIGNS/ANCILLARY NOTES, Brief Assessment, Lab Results, Assessment/Plan   Last Updated: 05-Mar-14 13:37 by Charlesetta Garibaldiejan Sie, Sheikh A (MD)

## 2014-07-13 NOTE — Consult Note (Signed)
64 y/o wfp with past history of Charcot Joints/Gout and a recent fall with comminuted femoral fracture, undergoing ORIF, consulted to our service. Pateint controls her daily pain with Tramadol 50 mg. Has taken stronger painmedications such as hydrocodone and oxycodone in the past, none of which completely eliminate the pain. She also takes Ibuprofen. With regards to the pain medications, the husband indicates that oxycodone causes GI problems. Her current pain is acute and therefore there is no need for a chronic pain management consult. At this point, I recommend that her post-op pain be managed by the surgeon in amanner no diffferent fromany other case, with the possible exception of using hydrocodone rather than oxycodone. I recommend that she be kept on her regular pain medications, including doseand schedule, and touse the hydrocodone to supplement it and as a break-thru-medication. At this point, I will not be taking over or following up on this case. Thank you for the consult. Francisco A. Laban EmperorNaveira, MD  Electronic Signatures: Odette FractionNaveira, Francisco Arturo (MD)  (Signed on 06-Mar-14 14:53)  Authored  Last Updated: 06-Mar-14 14:53 by Odette FractionNaveira, Francisco Arturo (MD)

## 2014-07-13 NOTE — Consult Note (Signed)
DATE OF BIRTH:  [birth year]  DATE OF CONSULTATION:  05/24/2012  PRIMARY CARE PHYSICIAN:   Dr. Ellsworth Lennox  REFERRING  PHYSICIAN:  Dr. Claris Gladden  CONSULTING PHYSICIAN:   Sona A. Allena Katz, MD  REASON FOR CONSULT:  Preop medical clearance.  HISTORY OF PRESENT ILLNESS:  The patient is a 64 year old Caucasian female with multiple medical problems, including history of diabetes, with Charcot disease, GERD,  history of congestive heart failure and chronic AFib, comes to the Emergency Room after she fell, losing her balance trying to climb up the truck. The patient has Charcot disease in both her legs. She wears protective special boots. She was trying to get up in the truck, lost her balance, landed on her right knee, causing her to have complex comminuted fracture involving the distal femoral shaft. Internal Medicine was consulted for preop medical clearance.   The patient states she is quite functional at home.  Her limitations are walking because of her Charcot joints. She denies any chronic chest pain, any history of angina, or any shortness of breath. She does not use oxygen at home. She does not have underlying lung problems. The  only complaint she has at this time is her right knee pain.   PAST MEDICAL HISTORY IS POSITIVE FOR:  1.  Congestive heart failure.  2.  GERD.   3.  History of MRSA.   4.  Charcot disease, has protective and special boots on the lower extremities. 5.  History of claustrophobia.  6.  Depression.  7.  Chronic AFib, off Coumadin because of GI bleed in the past.  8.  Hypertension.  9.  Diabetes.  10.  History of gout and chronic leg pain secondary to Charcot disease.  PAST SURGICAL HISTORY: 1.  Total hysterectomy.  2.  Right toe surgery.   ALLERGIES:  DILAUDID and TETRACYCLINE.   MEDICATIONS: 1.  Digoxin 125 mcg p.o. daily.  2.  Diazepam 5 mg b.i.d.  3.  Colchicine 0.6 mg p.o. daily.  4.  Enalapril 2.5 mg daily.  5.  Famotidine 20 mg b.i.d.  6.  Lasix 40 mg  daily as needed.  7.  Ibuprofen 200 mg 2 capsules b.i.d.  8.  Iron 150 with vitamin B12 and folic acid p.o. daily.  9.  Levemir 40 units b.i.d.  10.  Levothyroxine 50 mcg daily.  11.  Metformin 500 mg b.i.d.  12.  Nystatin topical ointment to affected areas as needed.  13.  Onglyza 5 mg daily.  14.  Simvastatin 20 mg daily.  15.  Spironolactone 25 mg daily.  16.  Tramadol 50 mg 1 tablet every 6 hours.   REVIEW OF SYSTEMS: CONSTITUTIONAL:  Positive for fatigue, weakness. No fever.  EYES:  No blurred or double vision. No glaucoma.  EARS, NOSE, THROAT:  No tinnitus, ear pain, hearing loss.  RESPIRATORY:  No cough, wheeze, hemoptysis or dyspnea.  CARDIOVASCULAR:  No chest pain, orthopnea, edema, arrhythmia or dyspnea on exertion.  GASTROINTESTINAL: No nausea, vomiting, diarrhea, abdominal pain or GERD. GENITOURINARY:  No dysuria or hematuria.  ENDOCRINE:  No polyuria, nocturia or thyroid problems.  HEMATOLOGY:  Positive for iron deficiency anemia. No easy bruising or bleeding.  SKIN:  No acne or rash.  MUSCULOSKELETAL: Positive for Charcot  joint and back pain.  NEUROLOGIC:  No CVA or TIA.   PSYCHIATRIC:  No anxiety. Positive for depression.  All other systems reviewed and negative.   FAMILY HISTORY:  Positive for hypertension.   SOCIAL HISTORY:  She lives at  home with her husband. Nonsmoker,  nonalcoholic. Denies any other drug use.   PHYSICAL EXAMINATION: GENERAL:  The patient is awake, alert, oriented x 3, not in acute distress.  VITAL SIGNS:  Afebrile, pulse is 59, blood pressure is 152/67,  sats are 96% on room air.  HEENT:  Atraumatic, normocephalic. PERLA. EOM intact. Oral mucosa is moist.  NECK:  Supple. No JVD. No carotid bruit.  RESPIRATORY: Clear to auscultation bilaterally. No rales, rhonchi, respiratory distress or labored breathing.  CARDIOVASCULAR: Both the heart sounds are normal. Rhythm is irregularly irregular. No murmur heard. PMI not lateralized. Chest nontender.  Good pedal pulses, good femoral pulses. No lower extremity edema.  ABDOMEN:  Soft, benign, nontender. No organomegaly. Positive bowel sounds.  NEUROLOGIC EXAM:  Grossly intact cranial nerves II through XII. No motor or sensory deficits.  PSYCHIATRIC: The patient is awake, alert, oriented x 3.  EXTREMITIES:  Right knee has swelling present. Decreased range of motion secondary to pain.   DIAGNOSTIC DATA:   EKG shows chronic atrial fibrillation.   Chest x-ray:  No acute disease of the chest.   CT of the right knee shows complex comminuted fracture involving the distal femoral shaft.   ASSESSMENT:  A 64 year old female with history of chronic atrial fibrillation, chronic kidney disease  stage II, type 2 diabetes, hypertension, is admitted on the Orthopedic floor with right knee comminuted fracture of the distal femoral shaft. Internal Medicine consulted for:   1.  Preop medical clearance. The patient has multiple medical problems at present. They are stable. The patient denies any chest pain. She is able to get around without difficulty at home, and no shortness of breath. Her only limitation is she cannot walk for too long, given her Charcot  joints in both the lower extremities. She does not have any history of coronary artery disease. The patient is at a low to intermediate risk for this noncardiac procedure. Okay to proceed with surgery.   2.  Chronic atrial fibrillation, not on Coumadin due to gastrointestinal bleed in the past. The patient's heart rate is under control. She is on digoxin, which will continue.   3.  Type 2 diabetes, on Levemir, Onglyza, which I will continue. I will hold off on metformin, given elevated creatinine of 1.4. Once after hydration creatinine improves, reintroduce metformin, else discontinue, given elevated creatinine. Will also give sliding scale insulin.   4. Hypertension. Blood pressure appears to be stable. She is on enalapril, which will continue.   5.   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 She is on tramadol, which I will continue. She has been taking ibuprofen 200 mg 2 tablets twice a day. I have held it right now. Will give her instead IV morphine as needed, given the mild increase in her creatinine.   6.  Chronic iron deficiency anemia. Will continue her iron supplement along with multivitamin.   7.  Deep vein thrombosis prophylaxis, per Dr. Claris Gladdenamasunder.   Thanks for the consult. Will follow while patient is in house.   Time spent: 50 minutes.     ____________________________ Wylie HailSona A. Allena KatzPatel, MD sap:mr D: 05/24/2012 18:29:00 ET T: 05/24/2012 18:57:21 ET JOB#: [id number]  cc: Sona A. Allena KatzPatel, MD, <Dictator> Sheikh A. Ellsworth Lennoxejan-Sie, MD  Willow OraSONA A PATEL MD ELECTRONICALLY SIGNED 05/26/2012 12:59

## 2014-07-13 NOTE — Consult Note (Signed)
Chief Complaint:  Subjective/Chief Complaint No new complaints [name] Kent/p surgery. BP slightly low today.   VITAL SIGNS/ANCILLARY NOTES: **Vital Signs.:   08-Mar-14 13:54  Vital Signs Type Q 4hr  Temperature Temperature (F) 98.5  Celsius 36.9  Temperature Source oral  Pulse Pulse 77  Respirations Respirations 17  Systolic BP Systolic BP 90  Diastolic BP (mmHg) Diastolic BP (mmHg) 60  Mean BP 70  Pulse Ox % Pulse Ox % 91  Pulse Ox Activity Level  At rest  Oxygen Delivery 2L   Brief Assessment:  Cardiac Irregular  no murmur   Respiratory clear BS   Gastrointestinal Normal   Gastrointestinal details normal Soft  Nontender   Additional Physical Exam right leg in soft splint   Assessment/Plan:  Invasive Device Daily Assessment of Necessity:  Does the patient currently have any of the following indwelling devices? foley   Indwelling Urinary Catheter continued, requirement due to   Reason to continue Indwelling Urinary Catheter preop or postop order according to surgical protocols   Assessment/Plan:  Assessment R femur fracture DM2 Afib HTN Charcot'Everton Bertha arthropathy Leucocytosis Acute on chronic kidney disease   Plan Ct SSRI, analgesia. Dc lasix Change ivf to ns at 100ml after 250 ml bolus  ct cefazolin   Electronic Signatures: Charlesetta Garibaldiejan Sie, Sheikh A (MD)  (Signed 08-Mar-14 15:18)  Authored: Chief Complaint, VITAL SIGNS/ANCILLARY NOTES, Brief Assessment, Assessment/Plan   Last Updated: 08-Mar-14 15:18 by Charlesetta Garibaldiejan Sie, Sheikh A (MD)

## 2014-07-13 NOTE — Consult Note (Signed)
PCP Dr Ellsworth Lennoxejan-sie chronic afibon coumadin due to GI bleed in the pastdenies any cp. able to get around w/o difficulty and no sob insulin HTN chronic pain syndrome due to bilateral charots joint and chronic gout  is at low to intermediate risk for this noncardiac urgery. ok to proceed with surgerycont pts home meds pre ad psot op and mangae Dm-2 accordingly for the consult  Electronic Signatures: Willow OraPatel, Sona A (MD)  (Signed on 04-Mar-14 18:11)  Authored  Last Updated: 04-Mar-14 18:11 by Willow OraPatel, Sona A (MD)

## 2014-07-13 NOTE — Consult Note (Signed)
Chief Complaint:  Subjective/Chief Complaint No new complaints now fully awake, oriented and talkative.  Anemic currently undergoing blood transfusion.   VITAL SIGNS/ANCILLARY NOTES: **Vital Signs.:   11-Mar-14 13:00  Vital Signs Type Pre-Blood  Temperature Temperature (F) 97.7  Celsius 36.5  Temperature Source oral  Pulse Pulse 74  Respirations Respirations 18  Systolic BP Systolic BP 130  Diastolic BP (mmHg) Diastolic BP (mmHg) 83  Mean BP 98  Pulse Ox % Pulse Ox % 92  Oxygen Delivery Room Air/ 21 %   Brief Assessment:  Cardiac Irregular  no murmur   Respiratory clear BS   Gastrointestinal Normal   Gastrointestinal details normal Soft  Nontender   Additional Physical Exam right leg in soft splint   Assessment/Plan:  Invasive Device Daily Assessment of Necessity:  Does the patient currently have any of the following indwelling devices? foley   Indwelling Urinary Catheter continued, requirement due to   Reason to continue Indwelling Urinary Catheter preop or postop order according to surgical protocols   Assessment/Plan:  Assessment R femur fracture DM2 Afib HTN Charcot's arthropathy Leucocytosis Acute on chronic kidney disease-cr back to baseline Delirium Anemia   Plan Ct SSRI, analgesia. Psych input noted-ct haldol and diazepam  prn Dc planning for today per ortho   Electronic Signatures: Charlesetta Garibaldiejan Sie, Sheikh A (MD)  (Signed 11-Mar-14 14:08)  Authored: Chief Complaint, VITAL SIGNS/ANCILLARY NOTES, Brief Assessment, Assessment/Plan   Last Updated: 11-Mar-14 14:08 by Charlesetta Garibaldiejan Sie, Sheikh A (MD)

## 2014-07-13 NOTE — Op Note (Signed)
PATIENT NAME:  [name] Kent, [name] Kent MR#:  [id number] DATE OF BIRTH:  11-12-1950  DATE OF PROCEDURE:  05/26/2012  PREOPERATIVE DIAGNOSIS: Right supracondylar femur fracture.   POSTOPERATIVE DIAGNOSIS: Right supracondylar femur fracture.   PROCEDURE PERFORMED: Open reduction and internal fixation of right distal femur fracture.   SURGEON: Murlean HarkShalini Ramasunder, MD   ASSISTANT: None.   ANESTHESIA: Spinal and sedation.   ESTIMATED BLOOD LOSS: 200 mL.  TOURNIQUET TIME: None.  SPECIMEN: None.   OPERATIVE FINDINGS:  Comminuted supracondylar femur fracture. Poor bone quality.   DRAINS:  None.  IMPLANTS:  Synthes LCP periarticular supracondylar femur plate.   COMPLICATIONS: No immediate complications noted.   DISPOSITION: The patient will be nonweightbearing. She will be placed in a hinged knee brace. She will be started on physical therapy for gait and transfer training as well as for gentle range of motion of the knee. She will be given 24 hours of prophylactic antibiotics postoperatively. She will be admitted for pain control. She will be placed on DVT prophylaxis.    INDICATIONS FOR PROCEDURE:  Ms. [name] Kent is a 64 year old female who had fallen while attempting to get into her [employer]. She sustained a right supracondylar distal femur fracture. The patient has a complicated medical history. She explains that in 2011 and in 2012 she was placed in a medically induced coma due to significant seizure activity. She explains that she was essentially bed ridden for nearly 1 year. Over the past 1 year, she has slowly been attempting to regain her mobility. She is ambulatory within the house with use of a walker. Outside of her house she uses a wheelchair for the most part.   DESCRIPTION OF PROCEDURE: The patient was identified in the preoperative holding area. Her right lower extremity was marked as the operative site. She was in a knee immobilizer. All the risks and benefits of surgery were explained to the  patient and her husband. They decided to proceed with surgical intervention. Informed consent was obtained.   The patient was brought into the operating room. She was placed on a radiolucent table, in a supine position. Spinal anesthesia was administered and the patient was sedated.   Surgical timeout was performed identifying the patient, procedure, operative site, confirming site marking, confirming administration of antibiotics, and confirming radiographic images.   The right lower extremity was prepared and draped in the usual sterile fashion. After surgical timeout, as previously described, fluoroscopy was brought in. The knee joint line was marked. The proximal extent of the fracture was marked. A distal incision measuring approximately 3-1/2 inches was made over the lateral aspect of the femoral condyle. Sharp dissection was carried down through the IT band. Blunt dissection was carried down to bone. A similar incision was made just proximal to the proximal extent of the fracture. Sharp dissection was carried down through the fascia and blunt dissection was carried down through the muscle to the bone. Using a Cobb inserted through the distal incision aiming proximally, the bone was gently cleared of soft tissue. Manual reduction was attempted. Reduction was nicely achieved, however, it was difficult to maintain. Multiple bumps were placed in various positions underneath the distal fragment in order to maintain reduction. At this time, a Synthes 8-hole LCP plate was inserted in distal to proximal fashion. Care was taken not to expose the fracture or strip the fracture of vital blood supply. A pin was placed through the central 7.3 locking hole distally. A second pin was placed to control rotation of the plate.  The fracture was then reduced and the plate was aligned proximally and held in place with a clamp. After adequate reduction of the fracture and adequate placement of the plate was confirmed on  orthogonal radiographs, a 7.3 mm conical screw was placed in the central hole of the distal aspect of the plate. Placement and length of the screw were confirmed on orthogonal radiographs. Fracture alignment and plate alignment were again confirmed.  Five holes surrounding the central 7.3 condylar screw were drilled with pins. After measurement of pins and confirmation of placement of the plate and reduction, these holes were filled with 5.0 locking screws. At this time, attention was turned to the femoral shaft. Going to the proximal most aspect of the fracture, 3 bicortical nonlocking screws were drilled and placed achieving excellent purchase.   Radiographs of the fracture site, the proximal extent of the plate, and the knee were obtained orthogonally. These demonstrated good placement of the plate as well as good reduction of the fracture. There was significant comminution at the fracture site. A bridging plate technique was used and the fracture not exposed.   The 7.3 mm conical screw was removed and a 7.3 mm locking screw was replaced for increased fixed angle stability in the distal fragment.   Both incisions were copiously irrigated with sterile saline. The fascia was closed using 0 Vicryl suture. Subcutaneous tissue was closed using 2-0 Vicryl suture. Skin was closed using staples. Sterile dressings were applied. The patient was placed in a bulky dressing. She was wrapped from the toe to the thigh with a bias wrap. She was placed in a hinged knee brace.   The patient was awoken. She was returned to the postoperative care unit in stable condition.   DISPOSITION:  The patient will be nonweightbearing. She will start on gentle range of motion of the knee with physical therapy. She will be instructed on nonweightbearing transfers. She will be placed on DVT prophylaxis and 24 hours of antibiotic prophylaxis. ____________________________ Murlean Hark, MD sr:sb D: 06/02/2012 12:29:04  ET T: 06/02/2012 12:44:37 ET JOB#: [id number]  cc: Murlean Hark, MD, <Dictator> Murlean Hark MD ELECTRONICALLY SIGNED 07/01/2012 15:16

## 2014-07-14 NOTE — H&P (Signed)
PATIENT NAME:  [name] Kent, Tyrene MR#:  [id number] DATE OF BIRTH:  [birth year]  DATE OF ADMISSION:  01/10/2014  PRIMARY CARE PROVIDER:  Marland McalpineSheikh A. Tejan-Sie.   CHIEF COMPLAINT: Fall with hypoglycemia and left arm pain.   HISTORY OF PRESENTING ILLNESS: A 64 year old female patient with history of hypertension, diabetes, atrial fibrillation, who presents to the Emergency Room after she fell off the bed, was found to be hypoglycemic by her husband. The patient also had significant pain in her left arm; has been found to have a left humerus fracture in the Emergency Room on x-ray.   The patient mentioned that she takes Humulin-N and Humulin-R 50 units each b.i.d. and skips these doses if she does not eat. Yesterday she had a good meal, took the dose of insulin. Today her blood sugar is 44. She has had recurrent episodes of hypoglycemia at home and EMS had to come in.  She had some food and got better. Today in the Emergency Room, the patient also was found to have acute renal failure with elevated creatinine and is being admitted to the hospitalist service. She has no other concerns at this point other than the left arm pain. She has a sling in place.   The patient mentions that she was supposed to go see a kidney doctor as her kidney numbers have been progressively worsening over the past few months according to her PCP. Her baseline creatinine is unknown, but her creatinine 1 year back was normal at 1.   PAST MEDICAL HISTORY:  1.  Insulin-dependent diabetes mellitus.  2.  Hypertension.  3.  History of MRSA.  4.  Chronic diastolic congestive heart failure.  5.  Charcot disease.  6.  Claustrophobia.  7.  Depression.  8.  Chronic atrial fibrillation, off Coumadin because of GI bleed.  9.  Gout.  10. Chronic pain syndrome.   PAST SURGICAL HISTORY: Total hysterectomy and right toe surgery.   ALLERGIES: DILAUDID, TETRACYCLINE.   HOME MEDICATIONS:  1.  Digoxin 125 mcg once daily.  2.  Diazepam 5 mg  b.i.d.  3.  Colchicine 0.6 mg daily.  6.  Lasix 40 mg daily.  7.  Ibuprofen 200 mg 2 capsules b.i.d.  8.  Humulin-N 50 units b.i.d.  9.  Levothyroxine 50 mcg daily.  10. Metformin 5 mg b.i.d. 11. Nystatin topical ointment to affected area as needed.  12. Simvastatin 20 mg daily.  13. Spironolactone 25 mg daily.  14. Tramadol 50 mg every 6 hours as needed.   REVIEW OF SYSTEMS:   CONSTITUTIONAL:  Complains of some fatigue.  EYES: No blurred vision, pain, redness. ENT:  No tinnitus, ear pain, hearing loss.  RESPIRATORY: No cough, wheeze, hemoptysis.  CARDIOVASCULAR: No chest pain, orthopnea, edema.  GASTROINTESTINAL: No nausea, vomiting, diarrhea, abdominal pain.  GENITOURINARY: No dysuria, hematuria, or frequency.  ENDOCRINE: No polyuria, nocturia. Has hypothyroidism.  HEMATOLOGIC AND LYMPHATIC: Has anemia.  MUSCULOSKELETAL: Has pain in her left arm from the fracture.  NEUROLOGIC: No focal numbness, weakness. PSYCHIATRIC:  Depression.   FAMILY HISTORY: Hypertension.   SOCIAL HISTORY: The patient lives at home with her husband.  Does not smoke. No alcohol or illicit drug use. Ambulates with rolling walker at home.   PHYSICAL EXAMINATION:  VITAL SIGNS: Temperature 97.4, pulse 96, blood pressure 116/71, oxygen saturation 92% on room air.  GENERAL: Obese Caucasian female patient sitting up in a chair in distress secondary to the pain in the arm.  PSYCHIATRIC:  Alert, oriented x 3, pleasant.  HEENT: Atraumatic, normocephalic.  Mucous membranes dry, pink. External ears and nose normal. Pallor positive. No icterus. Pupils are bilaterally equal and reactive to light.  NECK: Supple, no thyromegaly or palpable lymph nodes. Trachea midline. No carotid bruit or JVD.  CARDIOVASCULAR: S1, S2, without any murmurs, peripheral pulses 2+. No edema.  RESPIRATORY: Normal work of breathing. Clear to auscultation on both sides.  GASTROINTESTINAL: Soft abdomen, nontender. Bowel sounds present, No  hepatosplenomegaly palpable.  SKIN: Warm and dry. No petechiae, rash, ulcers.  MUSCULOSKELETAL: No joint swelling, redness, effusion of large joints. Normal muscle tone. Has tenderness of the left upper extremity and has arm in a sling.  NEUROLOGICAL: Motor strength 5/5 in upper and lower extremities.  LYMPHATIC:  No cervical lymphadenopathy.   LABORATORY DATA: Glucose of 44 per EMS and 92 in the ER. BUN 86, creatinine 2.5, sodium 139, potassium 4.4, WBC 15.8, hemoglobin 11.8, platelets of 250,000.   X-ray left humerus: Shows acute nondisplaced transverse fracture through condylar distal humerus.   ASSESSMENT AND PLAN:  1.  Hypoglycemia secondary to increased insulin dosing and inadequate diet in a patient who has had wide fluctuations in her blood sugars at home. At this point the patient is off her insulin. Once her sugars have improved we will start her on insulin 70/30 mix at 40 units b.i.d., put her on sliding scale insulin and monitor blood sugars closely. The patient was on Levemir in the past with which her blood sugars were well controlled, but this was too expensive for her and had to be changed to a different insulin.  I have counseled the patient to be consistent with her dietary practices.  2.  Acute renal failure with chronic kidney disease, baseline creatinine is not known, but creatinine 1 year back was normal at 1, presently 2.5.  Will started her on  IV fluids and monitor closely. The patient is making urine. No signs of obstruction. No pain.  3.  Left humerus fracture. Consult orthopedics for further input with the case.  4.  Paroxysmal atrial fibrillation. Continue rate control medications. 5.  Hypertension.  Continue home medications.  6.  DVT prophylaxis with Lovenox.   CODE STATUS:  Full code.   Time spent today on this case: 50 minutes.    ____________________________ Molinda Bailiff Shereece Wellborn, MD srs:lt D: 01/10/2014 13:53:26 ET T: 01/10/2014 14:24:22  ET JOB#: [id number] cc: Wardell Heath R. Jun Osment, MD, <Dictator> Sheikh A. Ellsworth Lennox, MD  Orie Fisherman MD ELECTRONICALLY SIGNED 01/25/2014 14:51

## 2014-07-14 NOTE — Consult Note (Signed)
Brief Consult Note: Diagnosis: Left supracondylar elbow fracture.   Patient was seen by consultant.   Consult note dictated.   Comments: Plan to treat this fracture non-operatively.  Continue posterior splint on left elbow and sling until patient follows up in my office in 7-10 days.   NO weightbearing on the left upper extremity x 8-12 weeks.  Electronic Signatures: Juanell FairlyKrasinski, Tomislav Micale (MD)  (Signed 21-Oct-15 18:52)  Authored: Brief Consult Note   Last Updated: 21-Oct-15 18:52 by Juanell FairlyKrasinski, Corlette Ciano (MD)

## 2014-07-14 NOTE — Consult Note (Signed)
PATIENT NAME:  [name] Kent, Ariele MR#:  [id number] DATE OF BIRTH:  20-Feb-1951  DATE OF CONSULTATION:  01/10/2014  REFERRING PHYSICIAN:  Milagros LollSrikar Sudini, MD CONSULTING PHYSICIAN:  Kathreen DevoidKevin L. Michial Disney, MD  REASON FOR CONSULTATION: Left distal humerus fracture.     HISTORY OF PRESENT ILLNESS:  The patient is a 64 year old female with a history of hypertension, diabetes, and atrial fibrillation, who fell out of bed. She states this was due to hyperglycemia. The patient sustained an injury to the left elbow during her fall. [facility] she was found to have a left distal humerus fracture by x-ray.   The patient was seen in her hospital room today. She was in a posterior splint and is not currently having significant pain and denies any numbness or tingling in her left upper extremity.   PAST MEDICAL HISTORY: Includes insulin-dependent diabetes, hypertension, history of MRSA, chronic diastolic congestive heart failure, Charcot disease, claustrophobia, depression, chronic atrial fibrillation off of Coumadin because of previous GI bleed, gout, chronic pain.   HOME MEDICATIONS: Include digoxin 125 mcg daily, diazepam 5 mg t.i.d.,  nystatin topical ointment to affected area as needed, simvastatin 20 mg daily, tramadol 50 mg q. 6 hours p.r.n. for pain, enalapril 2.5 mg p.o. daily,  cyclobenzaprine 10 mg b.i.d., famotidine 20 mg b.i.d., Lasix 40 mg b.i.d., metformin 500 b.i.d., and metoprolol b.i.d. the dose is unknown.   ALLERGIES: COUMADIN, DILAUDID, AND TETRACYCLINE.    PHYSICAL EXAMINATION: The patient is sitting upright in her hospital bed. She is wearing a sling on the left arm along with a posterior splint. This was not unwrapped. The Emergency Room physician when he called me last night had reported that the skin was intact. The patient has a palpable radial pulse. Her fingers are well-perfused and she can flex and extend all 5 digits. She has intact sensation to light touch.  There is no evidence of skin breakdown at the proximal or distal ends of the splint.   RADIOLOGY: I reviewed the humerus and elbow films. These films show approximately 5 mm of medial translation of the articular surface of the distal humerus. There is a transverse fracture through the distal humerus approximately 10-15 mm above the joint line. On the lateral view there is no significant angulation.   ASSESSMENT: Minimally displaced supracondylar fracture of the left elbow.   PLAN: I explained to Miss Shibley that it would be very difficult to surgically fix this fracture. It is in acceptable position currently. I recommended that we continue with her posterior splint. She will return to my office in approximately 7-10 days at which point we will repeat x-rays. As long as it remains minimally displaced we will place her in a long-arm cast. If there is displacement then she may require surgical fixation with K-wires. The fracture is too distal to fix with any plate and screws. The patient understood and agreed with this plan.     ____________________________ Kathreen DevoidKevin L. Saed Hudlow, MD klk:bu D: 01/10/2014 18:00:00 ET T: 01/10/2014 21:11:25 ET JOB#: [id number]  cc: Kathreen DevoidKevin L. Ezzard Ditmer, MD, <Dictator> Kathreen DevoidKEVIN L Derk Doubek MD ELECTRONICALLY SIGNED 01/17/2014 15:32

## 2014-07-14 NOTE — Discharge Summary (Signed)
PATIENT NAME:  [name] Kent, [name] MR#:  [id number] DATE OF BIRTH:  [birth year]  DATE OF ADMISSION:  01/10/2014 DATE OF DISCHARGE:  01/13/2014  ADMITTING PHYSICIAN: [name] R. Sudini, Kent   DISCHARGE PHYSICIAN: [name] DownsJaved Masoud, Kent  CONSULTATIONS: Orthopedics, [name] DevoidKevin L. Krasinski, Kent.  IMAGINGCathlean Sauer: X-ray, left humerus, 01/10/2014. Acute nondisplaced transverse fracture through the distal humerus   DISCHARGE DIAGNOSES:  1.  Hyperglycemia.  2.  Renal insufficiency.  3.  Fracture of left humerus.   HOSPITAL COURSE: This lady was admitted through the Emergency Room, where she was taken after sustaining a fall at home after her husband found her to have severe episodes of hypoglycemia. Please refer to the history and physical for full details. The patient was admitted to a medical and monitored bed. Her x-ray revealed her to have a left humeral fracture. Her complete metabolic panel revealed acute kidney insufficiency. During her hospitalization the patient continued to have recurrent bouts of hypoglycemia, but after adequate oral intake her blood glucose gradually improved. The patient complained of severe pain which eventually was controlled with opiate analgesia. Dr. Martha Kent evaluated her for her humeral fracture and did not require surgical intervention. She did receive a splint which she was discharged home with. The patient was discharged to home with home health in satisfactory condition.   DISCHARGE MEDICATIONS: Per the medical reconciliation.   DISCHARGE INSTRUCTIONS: DIET: Low-sodium ADA diet.   ACTIVITY: As tolerated.  The patient instructed to eat 3 meals a day plus 2 snacks and to inform our office if she continues to experience symptoms consistent with hypoglycemia, namely, lightheadedness, dizziness, excessive anxiety, or tremulousness.  DISCHARGE PROCESS TIME SPENT: 35 minutes.    ____________________________ [name] FloodSheikh A. Ellsworth Lennoxejan-Sie, Kent sat:ST D: 01/20/2014 13:50:48 ET T: 01/21/2014  01:21:42 ET JOB#: [id number]  cc: [name] A. Ellsworth Lennoxejan-Sie, Kent, <Dictator> [name] GaribaldiSHEIKH A TEJAN-SIE Kent ELECTRONICALLY SIGNED 02/01/2014 10:01
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
## 2014-07-22 NOTE — Consult Note (Signed)
Brief Consult Note: Diagnosis: delirium.   Patient was seen by consultant.   Consult note dictated.   Orders entered.   Discussed with Attending MD.   Comments: Psychiatry: Note dictated. Patient is currently delirious and lacks any capacity to make decisions regarding her medical care. Orders done for stat iv haldol and ativan and prn haldol thereafter.  Electronic Signatures: Audery Amellapacs, Jiah Bari T (MD)  (Signed 31-Mar-16 19:12)  Authored: Brief Consult Note   Last Updated: 31-Mar-16 19:12 by Audery Amellapacs, Vidhi Delellis T (MD)

## 2014-07-22 NOTE — Discharge Summary (Signed)
PATIENT NAME:  [name] JaegersKUJALA, Lorrie MR#:  [id number] DATE OF BIRTH:  1950-10-02  DATE OF ADMISSION:  06/19/2014 DATE OF DISCHARGE:  06/24/2014  ADMISSION DIAGNOSES:  1.  Sepsis, likely multifocal pneumonia. 2.  Acute on chronic renal failure.   DISCHARGE DIAGNOSES: 1.  Sepsis secondary to multifocal pneumonia, community-acquired.  2.  Acute on chronic renal failure secondary to acute tubular necrosis.  3.  Diabetes.  4.  History of atrial fibrillation.  5.  History of seizure disorder.   CONSULTATIONS:  1.  Audery AmelJohn T. Clapacs, MD 2.  Laverda SorensonSarath C. Kolluru, MD   LABORATORY DATA: At discharge, sodium 136, potassium 4.6, chloride 112, bicarbonate 20, BUN 56, creatinine 1.59, glucose is 237.   Blood cultures negative to date.   A 2-D echocardiogram showed an ejection fraction of 55% to 60%. Rhythm suggestive of atrial fibrillation. Mild to moderate mitral valve regurgitation, moderate to severe tricuspid regurgitation, and severely elevated pulmonary arterial systolic pressure.   PHYSICAL EXAMINATION: At discharge:  VITAL SIGNS: Temperature 97.7, pulse 97, respirations 18, blood pressure 121/71, and 91% on room air.  GENERAL: The patient was alert, not in acute distress.  CARDIOVASCULAR: Irregularly irregular with a 3/6 systolic ejection murmur heard best at the right sternal border. Hard to appreciate PMI.  LUNGS: Minimal crackles at the bases. No wheezing or rhonchi heard. No dullness to percussion.  ABDOMEN: Obese. Bowel sounds positive. Nontender, nondistended. No hepatosplenomegaly.  EXTREMITIES: No clubbing, cyanosis, or edema.   HOSPITAL COURSE: A 64 year old female with a history of atrial fibrillation, cognitive impairment, hypertension, diastolic heart failure, chronic kidney disease, who presented for sepsis. For further details, please refer to H and P.  1.  Sepsis from multilobar pneumonia. Blood culture negative to date. The patient was placed on IV antibiotics, Zosyn and Zithromax.  She was changed to p.o. Zithromax at discharge. As mentioned, blood cultures were negative to date. She is doing well from a respiratory standpoint of view. Sepsis has resolved. She does have a history of ventilator dependency in the past and tracheostomy. She is now currently off of oxygen.  2.  Acute delirium. The patient has baseline cognitive impairment as per my discussion with the patient's husband and that is why she had delirium while she was in the hospital. Also due in part from her sepsis and pneumonia. She seems to be at her baseline. CT of the head was normal.  3.  Acute on chronic renal failure from ATN. ATN was from her sepsis. Appreciate nephrology consult. Her baseline creatinine is 1.5. She is now at baseline.  4.  IDDM. The patient will continue her outpatient insulin regimen. Blood sugars were monitored while she was in the hospital.  5.  History of atrial fibrillation. The patient remains in atrial fibrillation. Her rate is controlled. She is on digoxin and metoprolol. No anticoagulation due to history of GI bleeding.  6.  Hypothyroidism. The patient was continued on her Synthroid.   DISCHARGE MEDICATIONS:  1.  Digoxin 0.125 mcg daily.  2.  Famotidine 20 mg daily.  3.  Simvastatin 20 mg at bedtime.  4.  Tramadol 50 mg q. 6 hours p.r.n.  5.  Diazepam 5 mg t.i.d. p.r.n.  6.  Synthroid 100 mcg daily.  7.  Humulin R 55 units b.i.d.  8.  Allopurinol 100 mg daily.  9.  Novolin N 50 units b.i.d.  10.  Glipizide 5 mg daily.  11.  Metoprolol 50 mg q. 12 hours.  12.  Tizanidine 2 mg  q. 8 hours p.r.n.  13.  Keppra 500 mg q. 12 hours.  14.  Azithromycin 500 mg daily for 4 days.   DISCHARGE DIET: ADA, low-sodium diet.   DISPOSITION: Discharge with home health with physical therapy, nurse, and nurse aide. It is of note the patient was recommended to go to SNF as per physical therapy, but husband did not want her to go to rehabilitation and husband wanted to take her home with home  health.   DISCHARGE FOLLOWUP: The patient will need to follow up with Dr. Thedore Mins in 1 week, as well as Dr. Ellsworth Lennox.  TIME SPENT: Approximately 45 minutes.   The patient was stable for discharge.   ____________________________ Nyron Mozer P. Juliene Pina, MD spm:TT D: 06/24/2014 12:23:00 ET T: 06/25/2014 00:50:58 ET JOB#: [id number]  cc: Marland Mcalpine A. Ellsworth Lennox, MD Lavonya Hoerner P. Juliene Pina, MD, <Dictator> Dr. Lattie Corns P Dae Highley MD ELECTRONICALLY SIGNED 06/25/2014 12:42

## 2014-07-22 NOTE — H&P (Signed)
PATIENT NAME:  [name] Kent, [name] Kent DATE OF BIRTH:  Jan 20, 1951  DATE OF ADMISSION:  06/19/2014  ADMITTING PHYSICIAN: Enid Baasadhika Tarsha Blando, MD   PRIMARY CARE PHYSICIAN: Silas FloodSheikh A. Ellsworth Lennoxejan-Sie, MD  REASON FOR ADMISSION: Cough and fever.   HISTORY OF PRESENT ILLNESS: [name] Kent is a 64 year old Caucasian female with multiple past medical problems, including chronic atrial fibrillation not in any anticoagulation due to history of significant GI bleed, depression/anxiety, heartburn, hypertension, diabetes, congestive heart failure, CKD with baseline creatinine of 1.5, Charcot arthropathy, venous insufficiency on her throat but the venous insufficiency, who presents to the hospital from home secondary to worsening breathing, cough, and chest congestion going on for 4-5 days now. The patient saw Dr. Ellsworth Lennoxejan-Sie about 10 days ago for routine physical. She states she was feeling fine at the time, and all her symptoms started in the last 4-5 days and had gotten worse very rapidly. She also complains of chest pain from constant coughing, congestion, dyspnea on exertion. She does not have any diagnosis of COPD and never been on any home oxygen. She has congestive heart failure and no known ejection fraction available at this time.   The patient was noted to be septic with elevated white count of 25, end-organ damage with acute renal failure on CKD and is being admitted for sepsis secondary to pneumonia, as noted on her chest x-ray and also CT chest.   PAST MEDICAL HISTORY: 1. Depression/anxiety.  2. Atrial fibrillation, chronic.  3. Charcot arthroplasty.  4. CKD.  5. Congestive heart failure, unknown ejection fraction.  6. Type 2 diabetes mellitus.  7. Benign hypertension.  8. Gastroesophageal reflex disease.  9. Hypothyroidism.  10. Chronic venous insufficiency.   PAST SURGICAL HISTORY SIGNIFICANT FOR:  1. Hysterectomy.  2. Surgery on her big toe.  3. Left leg femur surgery after an accident. 4.  History of seizure disorder. 5. History of tracheostomy and percutaneous endoscopic gastrostomy tube placement and also reversal when she was in the ICU in 2013 for status epilepticus and respiratory failure.  ALLERGIES TO MEDICATIONS: COUMADIN, DILAUDID, AND TETRACYCLINE.   CURRENT HOME MEDICATIONS INCLUDE: 1. Allopurinol 100 mg p.o. daily.  2. Cyclobenzaprine 10 mg p.o. b.i.d. p.r.n.  3. Diazepam 5 mg p.o. 3 times a day.  4. Digoxin 125 mcg p.o. daily.  5. Enalapril 2.5 mg p.o. daily.  6. Famotidine 20 mg p.o. b.i.d.  7. Glipizide 5 mg p.o. daily.  8. Humulin R 55 units injectable twice a day.  9. Lasix 40 mg p.o. b.i.d.  10. Synthroid 100 mcg p.o. daily.  11. Metoprolol 25 mg p.o. b.i.d.  12. Zofran 4 mg p.o. q. 8 hours p.r.n. for nausea and vomiting.  13. Novolin N suspension 50 units subcutaneous b.i.d.  14. Simvastatin 20 mg p.o. daily.  15. Synthroid 75 mcg p.o. daily.  16. Tizanidine 2 mg p.o. q. 8 hours.  17. Tramadol 50 mg p.o. q. 6 hours p.r.n.   SOCIAL HISTORY: Lives at home with her husband. No smoking or alcohol use. Ambulates with the help of a cane.    FAMILY HISTORY SIGNIFICANT FOR: Heart disease in the family with early cardiac death.   REVIEW OF SYSTEMS:  CONSTITUTIONAL: Positive for fever, fatigue, and weakness.  EYES: No blurred vision, double vision, inflammation, or glaucoma.  EARS, NOSE, AND THROAT: No tinnitus, ear pain, hearing loss, epistaxis, or discharge.  RESPIRATORY: Positive for cough, wheeze, no COPD. Positive for dyspnea. No painful respiration.    CARDIOVASCULAR: Positive for chest pain and  also orthopnea, dyspnea on exertion. No palpitations, or syncope.  GASTROINTESTINAL: No nausea, vomiting, diarrhea, abdominal pain, hematemesis, or melena.  GENITOURINARY: No dysuria, hematuria, renal calculus, frequency, or incontinence.  ENDOCRINE: No polyuria, nocturia, thyroid problems, heat or cold intolerance.  HEMATOLOGY: No anemia, easy bruising, or  bleeding.  SKIN: No acne, rash, or lesions.  MUSCULOSKELETAL: Positive for gout. No arthritis.  NEUROLOGICAL: No numbness, weakness, CVA, transient ischemic attack, or seizures.  PSYCHOLOGIC: No anxiety, insomnia, and depression.   PHYSICAL EXAMINATION: VITAL SIGNS: Temperature 98 degrees Fahrenheit, pulse 113, respirations 20, blood pressure 92/63, pulse oximetry 89% on room air.  GENERAL: Well-built, well-nourished female in mild distress secondary to increased respiratory rate and also dyspnea on minimal exertion.  HEENT: Normocephalic, atraumatic. Pupils are equal, round, reacting to light. Anicteric sclerae. Extraocular movements intact.  OROPHARYNX: Clear without erythema, mass, or exudates.  NECK: Supple. No thyromegaly, JVD, or carotid bruits. No lymphadenopathy.  LUNGS: Moving air bilaterally. Coarse rhonchi bibasilar. No wheeze. No crackles, minimal use of accessory muscles on exertion  EXTREMITIES: Charcot joints in heavy boots. Will have to re-examine.  SKIN: No acne, rash, or lesions.  LYMPHATICS: No cervical lymphadenopathy.  NEUROLOGIC: Cranial nerves intact. No focal motor or sensory deficits.  PSYCHOLOGICAL: The patient is awake, alert, oriented x 3.   LABORATORY DATA: WBC 25.0, hemoglobin 10.3, hematocrit 33.1, platelet count 177,000. Sodium 134, potassium 5.0, chloride 103, bicarbonate 20, BUN 105, creatinine 3.6, glucose of 119 and calcium of 8.7. ALT 8, AST 15, alkaline phosphatase 156, total bilirubin 1.4, albumin of 3.4 BNP is 432. CT chest without contrast showing dense consolidation, left lower lobe. Areas of consolidation, right middle lobe and lingula, likely multifocal pneumonia. Troponin is slightly elevated at 0.04. Chest x-ray confirming multifocal pneumonia, as well. EKG showing atrial fibrillation, left axis deviation, and left bundle-branch block, which seems to be possible chronic.     ASSESSMENT AND PLAN: This is a 64 year old female with multiple medical  problems, including diastolic congestive heart failure seizure disorder, hypertension, insulin-dependent diabetes mellitus, chronic kidney disease, atrial fibrillation, gastroesophageal reflux disease, gout, admitted for sepsis.  1. Sepsis, likely from multifocal pneumonia. Blood cultures, sputum cultures, started with broad-spectrum antibiotics with vancomycin, Zosyn, and Zithromax. Monitor on telemetry. History of ventilator dependency in the past and also tracheostomy, now reversed, so monitor carefully. DuoNeb p.r.n., cough medicines, and IV fluids.  2. Acute on chronic renal failure. Baseline creatinine of 1.5, now at 3.5. Renal ultrasound. Nephrology consult, avoid nephrotoxins and IV fluids.  3. Insulin-dependent diabetes mellitus changed to Lantus b.i.d. Not sure how she was taking Humulin R and Novolin N 50 units both twice a day, as she had a history of hyperglycemia secondary to this in the past. Also on Onglyza  and also glipizide, also placed on sliding scale insulin.  4. Atrial fibrillation, rate controlled, digoxin and metoprolol. Not on anticoagulation due to gastrointestinal bleed history.  5. Seizure disorder. Continue Keppra.  6. Diastolic congestive heart failure, unknown ejection fraction. Get an echocardiogram. No prior comparison available. Hold her Lasix with acute renal failure and monitor 7. Deep vein thrombosis prophylaxis. Lovenox and subcutaneous heparin.Marland Kitchen   CODE STATUS: Full code.   TOTAL CRITICAL CARE TIME SPENT ON ADMITTING THIS PATIENT: 60 minutes.    ____________________________ Enid Baas, MD rk:mw D: 06/19/2014 20:13:13 ET T: 06/19/2014 21:09:29 ET JOB#: [id number]  cc: Enid Baas, MD, <Dictator> Sheikh A. Ellsworth Lennox, MD Enid Baas MD ELECTRONICALLY SIGNED 06/28/2014 12:33

## 2014-07-22 NOTE — Consult Note (Signed)
Psychiatry: Follow-up for this patient with delirium due to medical conditions.  The patient had no new complaints today.  On review of systems she denies acute pain.  Continues to feel some shortness of breath.  Denies having hallucinations.  Denies suicidal or homicidal ideation. is awake but only partially responsive.  No eye contact to speak of.  Psychomotor activity very slow.  Speech slow and minimal in amount.  Thoughts appear to be confused affect flat.  Denies suicidal or homicidal ideation.  Currently denies hallucinations doesn't appear to be responding to internal stimuli. from nursing are that she still has intermittent agitation and had pulled out an IV line earlier today.  Responds fairly well to the when necessary doses of Haldol. Patient's delirium is under adequate control with the current doses of Haldol.  If things worsen I would recommend increasing the frequency her dose of haloperidol or possibly reconsulting psychiatry.  I will follow-up after the weekend. delirium due to medical condition pneumonia  Electronic Signatures: Romie Tay, Jackquline DenmarkJohn T (MD)  (Signed on 01-Apr-16 19:14)  Authored  Last Updated: 01-Apr-16 19:14 by Audery Amellapacs, Yeraldi Fidler T (MD)

## 2015-01-04 ENCOUNTER — Emergency Department: Payer: Medicare Other

## 2015-01-04 ENCOUNTER — Emergency Department
Admission: EM | Admit: 2015-01-04 | Discharge: 2015-01-04 | Disposition: A | Discharge status: Home / Self Care | Payer: Medicare Other | Attending: Emergency Medicine | Admitting: Emergency Medicine

## 2015-01-04 DIAGNOSIS — Z794 Long term (current) use of insulin: Secondary | ICD-10-CM | POA: Diagnosis not present

## 2015-01-04 DIAGNOSIS — E119 Type 2 diabetes mellitus without complications: Secondary | ICD-10-CM | POA: Diagnosis not present

## 2015-01-04 DIAGNOSIS — S42402A Unspecified fracture of lower end of left humerus, initial encounter for closed fracture: Secondary | ICD-10-CM

## 2015-01-04 DIAGNOSIS — Y998 Other external cause status: Secondary | ICD-10-CM | POA: Diagnosis not present

## 2015-01-04 DIAGNOSIS — Z7984 Long term (current) use of oral hypoglycemic drugs: Secondary | ICD-10-CM | POA: Insufficient documentation

## 2015-01-04 DIAGNOSIS — S42492A Other displaced fracture of lower end of left humerus, initial encounter for closed fracture: Secondary | ICD-10-CM | POA: Diagnosis not present

## 2015-01-04 DIAGNOSIS — Y9389 Activity, other specified: Secondary | ICD-10-CM | POA: Diagnosis not present

## 2015-01-04 DIAGNOSIS — W01118A Fall on same level from slipping, tripping and stumbling with subsequent striking against other sharp object, initial encounter: Secondary | ICD-10-CM | POA: Diagnosis not present

## 2015-01-04 DIAGNOSIS — Z23 Encounter for immunization: Secondary | ICD-10-CM | POA: Insufficient documentation

## 2015-01-04 DIAGNOSIS — I10 Essential (primary) hypertension: Secondary | ICD-10-CM | POA: Diagnosis not present

## 2015-01-04 DIAGNOSIS — S50812A Abrasion of left forearm, initial encounter: Secondary | ICD-10-CM | POA: Insufficient documentation

## 2015-01-04 DIAGNOSIS — Z79899 Other long term (current) drug therapy: Secondary | ICD-10-CM | POA: Diagnosis not present

## 2015-01-04 DIAGNOSIS — Y9289 Other specified places as the place of occurrence of the external cause: Secondary | ICD-10-CM | POA: Insufficient documentation

## 2015-01-04 DIAGNOSIS — S59902A Unspecified injury of left elbow, initial encounter: Secondary | ICD-10-CM | POA: Diagnosis present

## 2015-01-04 MED ORDER — TETANUS-DIPHTH-ACELL PERTUSSIS 5-2.5-18.5 LF-MCG/0.5 IM SUSP
0.5000 mL | Freq: Once | INTRAMUSCULAR | Status: AC
Start: 2015-01-04 — End: 2015-01-04
  Administered 2015-01-04: 0.5 mL via INTRAMUSCULAR
  Filled 2015-01-04: qty 0.5

## 2015-01-04 MED ORDER — TRAMADOL HCL 50 MG PO TABS
50.0000 mg | ORAL_TABLET | Freq: Once | ORAL | Status: AC
  Administered 2015-01-04: 50 mg via ORAL
  Filled 2015-01-04: qty 1

## 2015-01-04 MED ORDER — TRAMADOL HCL 50 MG PO TABS: 50.0000 mg | ORAL_TABLET | Freq: Four times a day (QID) | ORAL | Status: AC | PRN

## 2015-01-04 MED ORDER — CEPHALEXIN 500 MG PO CAPS: 500.0000 mg | ORAL_CAPSULE | Freq: Two times a day (BID) | ORAL | Status: AC

## 2015-01-04 MED ORDER — BACITRACIN ZINC 500 UNIT/GM EX OINT
1.0000 "application " | TOPICAL_OINTMENT | Freq: Two times a day (BID) | CUTANEOUS | Status: DC
  Administered 2015-01-04: 1 via TOPICAL
  Filled 2015-01-04: qty 0.9

## 2015-01-04 MED ORDER — CEPHALEXIN 500 MG PO CAPS
500.0000 mg | ORAL_CAPSULE | Freq: Once | ORAL | Status: AC
  Administered 2015-01-04: 500 mg via ORAL
  Filled 2015-01-04: qty 1

## 2015-01-04 NOTE — Discharge Instructions (Signed)
Please call to schedule an appointment with orthopedics. If you are going to go to a specialist in [place], you should go ahead and call to schedule an appointment. If the appointment will be longer than 1 week away, I recommend that you see someone here before you leave.  Return to the ER for symptoms that change or worsen or for new concerns.

## 2015-01-04 NOTE — ED Provider Notes (Signed)
[facility] Emergency Department Provider Note  ____________________________________________  Time seen: Approximately 8:33 PM  I have reviewed the triage vital signs and the nursing notes.   HISTORY  Chief Complaint Fall and Arm Injury    HPI [name] Kent is a 64 y.o. female who presents to the emergency department for evaluation of left arm pain. She states that she lost her balance, stumbled, and hit her left arm and elbow on a metal rack. She states that she broke her arm last year in the same place. She states that she has never regained full function of the left elbow and has had pain since last year. The pain after her incident tonight is a little worse but there is no decrease in function. She is also complaining of some abrasions to the forearm. She states that she will need a tetanus shot as well.   Past Medical History  Diagnosis Date  . Charcot-Marie disease   . Hx MRSA infection   . Claustrophobia   . GERD (gastroesophageal reflux disease)   . Depression   . H/O blood clots   . Hypercholesterolemia   . HTN (hypertension)   . Diabetes   . Anxiety   . Bursitis, trochanteric   . Chronic kidney disease   . Syncope   . Obesity, morbid   . Sleep apnea   . Coronary artery disease   . Seizure   . Hypothyroidism   . Migraines   . Chronic atrial fibrillation     Previous massive life-threatening GI bleed while on warfarin. No previous cardioversion. Previous atrial thrombus noted on TEE in the past. Previous cardiac care was at [facility] clinic.  Marland Kitchen Left bundle branch block   . Chronic diastolic congestive heart failure     Patient Active Problem List   Diagnosis Date Noted  . Chronic atrial fibrillation (HCC)   . Chronic diastolic congestive heart failure [facility])     Past Surgical History  Procedure Laterality Date  . Total abdominal hysterectomy    . Toe amputation    . Tonsillectomy and adenoidectomy    . Femur surgery Right      Current Outpatient Rx  Name  Route  Sig  Dispense  Refill  . allopurinol (ZYLOPRIM) 100 MG tablet   Oral   Take 100 mg by mouth daily.          . cephALEXin (KEFLEX) 500 MG capsule   Oral   Take 1 capsule (500 mg total) by mouth 2 (two) times daily.   20 capsule   0   . cyclobenzaprine (FLEXERIL) 10 MG tablet   Oral   Take 10 mg by mouth 2 (two) times daily as needed for muscle spasms.         . diazepam (VALIUM) 5 MG tablet   Oral   Take 5 mg by mouth 2 (two) times daily.          . digoxin (LANOXIN) 0.125 MG tablet   Oral   Take 0.125 mg by mouth daily.         . enalapril (VASOTEC) 2.5 MG tablet   Oral   Take 2.5 mg by mouth daily.          . famotidine (PEPCID) 20 MG tablet   Oral   Take 20 mg by mouth 2 (two) times daily.          . furosemide (LASIX) 40 MG tablet   Oral   Take 40 mg by mouth  2 (two) times daily.         Marland Kitchen glipiZIDE (GLUCOTROL) 5 MG tablet   Oral   Take 5 mg by mouth 2 (two) times daily before a meal.          . ibuprofen (ADVIL,MOTRIN) 200 MG tablet   Oral   Take 400 mg by mouth as needed for pain.         Marland Kitchen LEVEMIR FLEXPEN 100 UNIT/ML SOPN      50 Units 2 (two) times daily.          Marland Kitchen levETIRAcetam (KEPPRA) 500 MG tablet   Oral   Take 500 mg by mouth 2 (two) times daily.          Marland Kitchen levothyroxine (SYNTHROID, LEVOTHROID) 50 MCG tablet   Oral   Take 50 mcg by mouth daily before breakfast.          . metFORMIN (GLUCOPHAGE) 500 MG tablet   Oral   Take 500 mg by mouth 2 (two) times daily with a meal.          . metoprolol tartrate (LOPRESSOR) 25 MG tablet   Oral   Take 50 mg by mouth 2 (two) times daily.          Marland Kitchen nystatin cream (MYCOSTATIN)   Topical   Apply topically as needed for dry skin.         . Pediatric Multivitamins-Iron (POLY-VITAMIN/IRON PO)   Oral   Take 150 mg by mouth daily.         . saxagliptin HCl (ONGLYZA) 5 MG TABS tablet   Oral   Take 5 mg by mouth daily.          . simvastatin (ZOCOR) 20 MG tablet   Oral   Take 20 mg by mouth daily.          Marland Kitchen spironolactone (ALDACTONE) 25 MG tablet   Oral   Take 25 mg by mouth daily.          . Thiamine HCl (VITAMIN B-1) 250 MG tablet   Oral   Take 250 mg by mouth daily.         . traMADol (ULTRAM) 50 MG tablet   Oral   Take 1 tablet (50 mg total) by mouth every 6 (six) hours as needed.   20 tablet   0     Allergies Coumadin; Dilaudid; and Tetracyclines & related  Family History  Problem Relation Age of Onset  . Cancer Mother   . Heart attack Father     Social History Social History  Substance Use Topics  . Smoking status: Never Smoker   . Smokeless tobacco: Not on file  . Alcohol Use: No    Review of Systems Constitutional: No recent illness. Eyes: No visual changes. ENT: No sore throat. Cardiovascular: Denies chest pain or palpitations. Respiratory: Denies shortness of breath. Gastrointestinal: No abdominal pain.  Genitourinary: Negative for dysuria. Musculoskeletal: Pain in left elbow Skin: Negative for rash. Neurological: Negative for headaches, focal weakness or numbness. 10-point ROS otherwise negative.  ____________________________________________   PHYSICAL EXAM:  VITAL SIGNS: ED Triage Vitals  Enc Vitals Group     BP 01/04/15 2020 182/109 mmHg     Pulse Rate 01/04/15 2020 51     Resp 01/04/15 2020 20     Temp 01/04/15 2020 97.7 F (36.5 C)     Temp Source 01/04/15 2020 Oral     SpO2 01/04/15 2020 94 %  Weight 01/04/15 2020 225 lb (102.059 kg)     Height 01/04/15 2020 5\' 3"  (1.6 m)     Head Cir --      Peak Flow --      Pain Score 01/04/15 2017 7     Pain Loc --      Pain Edu? --      Excl. in GC? --     Constitutional: Alert and oriented. Well appearing and in no acute distress. Eyes: Conjunctivae are normal. EOMI. Head: Atraumatic. Nose: No congestion/rhinnorhea. Neck: No stridor.  Respiratory: Normal respiratory effort.   Musculoskeletal:  Tenderness noted over the radial head area of the left elbow. No significant contusion or edema. Patient has limited extension of the left elbow which is at her baseline, but more painful than usual. Neurologic:  Normal speech and language. No gross focal neurologic deficits are appreciated. Speech is normal. No gait instability. Skin:  Skin is warm, dry and intact. Abrasions noted to the left forearm. Psychiatric: Mood and affect are normal. Speech and behavior are normal.  ____________________________________________   LABS (all labs ordered are listed, but only abnormal results are displayed)  Labs Reviewed - No data to display ____________________________________________  RADIOLOGY  Comminuted, displaced fracture of the distal humerus.  I, Kem Boroughsari Sani Loiseau, personally viewed and evaluated these images (plain radiographs) as part of my medical decision making.   ____________________________________________   PROCEDURES  Procedure(s) performed:  SPLINT APPLICATION Date/Time: 10:07 PM Authorized by: Kem Boroughsari Whitnie Deleon Consent: Verbal consent obtained. Risks and benefits: risks, benefits and alternatives were discussed Consent given by: patient Splint applied by: Waynetta SandyBeth, ER technician Location details: Long arm, left  Splint type: Long arm OCL  Supplies used: OCL and Ace  Post-procedure: The splinted body part was neurovascularly unchanged following the procedure. Patient tolerance: Patient tolerated the procedure well with no immediate complications.      ____________________________________________   INITIAL IMPRESSION / ASSESSMENT AND PLAN / ED COURSE  Pertinent labs & imaging results that were available during my care of the patient were reviewed by me and considered in my medical decision making (see chart for details).  Images from last October were reviewed. The patient sustained a distal radius fracture at that time as well. She states that she was treated with a sling  and cast. No surgery.  Tonight's injury and exam is not consistent with the degree of displacement of the bone fragments in the distal radius, however she will be treated with immobilization and instructed to follow-up with orthopedics. She states that she is moving back to [place] in 3 days and would like to follow up with orthopedics when she arrives there. She was strongly advised to go ahead and call to schedule an appointment with someone to see her within the week. She was advised that if she is unable to be seen within the week, she should see orthopedics here prior to leaving the area.  Patient was advised to return to the emergency department for symptoms that change or worsen if she is unable to schedule an appointment. ____________________________________________   FINAL CLINICAL IMPRESSION(S) / ED DIAGNOSES  Final diagnoses:  Humerus distal fracture, left, closed, initial encounter       Chinita PesterCari B Donalda Job, FNP 01/04/15 2208  Loleta Roseory Forbach, MD 01/06/15 [phone]

## 2015-01-04 NOTE — ED Notes (Addendum)
Patient reports lost her balance and fell.  Complains of left arm pain.

## 2017-11-21 DEATH — deceased
